# Patient Record
Sex: Female | Born: 1945
Health system: Southern US, Community
[De-identification: ages and names within clinical notes are randomized; demographics above are authoritative.]

## PROBLEM LIST (undated history)

## (undated) DIAGNOSIS — F329 Major depressive disorder, single episode, unspecified: Secondary | ICD-10-CM

## (undated) DIAGNOSIS — F32A Depression, unspecified: Secondary | ICD-10-CM

## (undated) DIAGNOSIS — C801 Malignant (primary) neoplasm, unspecified: Secondary | ICD-10-CM

## (undated) DIAGNOSIS — K219 Gastro-esophageal reflux disease without esophagitis: Secondary | ICD-10-CM

## (undated) DIAGNOSIS — M1990 Unspecified osteoarthritis, unspecified site: Secondary | ICD-10-CM

## (undated) HISTORY — PX: BREAST BIOPSY: SHX20

## (undated) HISTORY — PX: ABDOMINAL HYSTERECTOMY: SHX81

---
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
## 2013-05-08 ENCOUNTER — Ambulatory Visit: Payer: Self-pay | Admitting: Nurse Practitioner

## 2013-06-04 ENCOUNTER — Encounter (INDEPENDENT_AMBULATORY_CARE_PROVIDER_SITE_OTHER): Payer: Medicare Other | Admitting: Ophthalmology

## 2013-06-04 DIAGNOSIS — H43819 Vitreous degeneration, unspecified eye: Secondary | ICD-10-CM

## 2013-06-04 DIAGNOSIS — H348392 Tributary (branch) retinal vein occlusion, unspecified eye, stable: Secondary | ICD-10-CM

## 2013-06-04 DIAGNOSIS — H2510 Age-related nuclear cataract, unspecified eye: Secondary | ICD-10-CM

## 2013-07-05 ENCOUNTER — Emergency Department: Payer: Self-pay | Admitting: Emergency Medicine

## 2013-07-05 LAB — CBC
HCT: 40.7 % (ref 35.0–47.0)
HGB: 14 g/dL (ref 12.0–16.0)
MCH: 30.6 pg (ref 26.0–34.0)
MCHC: 34.4 g/dL (ref 32.0–36.0)
RBC: 4.58 10*6/uL (ref 3.80–5.20)
RDW: 13.5 % (ref 11.5–14.5)

## 2013-07-05 LAB — URINALYSIS, COMPLETE
Bilirubin,UR: NEGATIVE
Blood: NEGATIVE
Glucose,UR: NEGATIVE mg/dL (ref 0–75)
Nitrite: NEGATIVE
Ph: 7 (ref 4.5–8.0)
Protein: NEGATIVE
RBC,UR: 1 /HPF (ref 0–5)
Specific Gravity: 1.015 (ref 1.003–1.030)
Squamous Epithelial: 1

## 2013-07-05 LAB — COMPREHENSIVE METABOLIC PANEL
Albumin: 3.7 g/dL (ref 3.4–5.0)
Alkaline Phosphatase: 59 U/L (ref 50–136)
Chloride: 110 mmol/L — ABNORMAL HIGH (ref 98–107)
Co2: 28 mmol/L (ref 21–32)
EGFR (African American): >60
EGFR (Non-African Amer.): >60
Glucose: 81 mg/dL (ref 65–99)
Potassium: 4.8 mmol/L (ref 3.5–5.1)
SGOT(AST): 31 U/L (ref 15–37)
SGPT (ALT): 19 U/L (ref 12–78)
Total Protein: 6.2 g/dL — ABNORMAL LOW (ref 6.4–8.2)

## 2013-07-05 LAB — CK TOTAL AND CKMB (NOT AT ARMC): CK, Total: 79 U/L (ref 21–215)

## 2013-07-05 LAB — TROPONIN I
Troponin-I: <0.02 ng/mL
Troponin-I: <0.02 ng/mL

## 2013-07-07 LAB — URINE CULTURE

## 2013-10-05 ENCOUNTER — Ambulatory Visit (INDEPENDENT_AMBULATORY_CARE_PROVIDER_SITE_OTHER): Payer: Medicare Other | Admitting: Ophthalmology

## 2013-10-05 DIAGNOSIS — H43819 Vitreous degeneration, unspecified eye: Secondary | ICD-10-CM

## 2013-10-05 DIAGNOSIS — H348392 Tributary (branch) retinal vein occlusion, unspecified eye, stable: Secondary | ICD-10-CM

## 2013-10-05 DIAGNOSIS — H2510 Age-related nuclear cataract, unspecified eye: Secondary | ICD-10-CM

## 2014-10-18 ENCOUNTER — Ambulatory Visit (INDEPENDENT_AMBULATORY_CARE_PROVIDER_SITE_OTHER): Payer: Medicare Other | Admitting: Ophthalmology

## 2014-11-01 ENCOUNTER — Ambulatory Visit (INDEPENDENT_AMBULATORY_CARE_PROVIDER_SITE_OTHER): Payer: PPO | Admitting: Ophthalmology

## 2014-11-01 DIAGNOSIS — H35371 Puckering of macula, right eye: Secondary | ICD-10-CM | POA: Diagnosis not present

## 2014-11-01 DIAGNOSIS — H34831 Tributary (branch) retinal vein occlusion, right eye: Secondary | ICD-10-CM

## 2014-11-01 DIAGNOSIS — H43813 Vitreous degeneration, bilateral: Secondary | ICD-10-CM

## 2014-11-16 ENCOUNTER — Ambulatory Visit: Payer: Self-pay | Admitting: Nurse Practitioner

## 2015-11-07 ENCOUNTER — Ambulatory Visit (INDEPENDENT_AMBULATORY_CARE_PROVIDER_SITE_OTHER): Payer: PPO | Admitting: Ophthalmology

## 2015-11-07 ENCOUNTER — Other Ambulatory Visit: Payer: Self-pay | Admitting: Nurse Practitioner

## 2015-11-07 DIAGNOSIS — Z79899 Other long term (current) drug therapy: Secondary | ICD-10-CM | POA: Diagnosis not present

## 2015-11-07 DIAGNOSIS — Z0000 Encounter for general adult medical examination without abnormal findings: Secondary | ICD-10-CM | POA: Diagnosis not present

## 2015-11-07 DIAGNOSIS — M1990 Unspecified osteoarthritis, unspecified site: Secondary | ICD-10-CM | POA: Diagnosis not present

## 2015-11-07 DIAGNOSIS — K219 Gastro-esophageal reflux disease without esophagitis: Secondary | ICD-10-CM | POA: Diagnosis not present

## 2015-11-07 DIAGNOSIS — F3289 Other specified depressive episodes: Secondary | ICD-10-CM | POA: Diagnosis not present

## 2015-11-07 DIAGNOSIS — E782 Mixed hyperlipidemia: Secondary | ICD-10-CM | POA: Diagnosis not present

## 2015-11-07 DIAGNOSIS — Z1231 Encounter for screening mammogram for malignant neoplasm of breast: Secondary | ICD-10-CM | POA: Diagnosis not present

## 2015-11-07 DIAGNOSIS — Z1159 Encounter for screening for other viral diseases: Secondary | ICD-10-CM | POA: Diagnosis not present

## 2016-05-14 DIAGNOSIS — E782 Mixed hyperlipidemia: Secondary | ICD-10-CM | POA: Diagnosis not present

## 2016-05-14 DIAGNOSIS — Z79899 Other long term (current) drug therapy: Secondary | ICD-10-CM | POA: Diagnosis not present

## 2016-05-14 DIAGNOSIS — K219 Gastro-esophageal reflux disease without esophagitis: Secondary | ICD-10-CM | POA: Diagnosis not present

## 2016-05-14 DIAGNOSIS — F3289 Other specified depressive episodes: Secondary | ICD-10-CM | POA: Diagnosis not present

## 2016-05-14 DIAGNOSIS — R200 Anesthesia of skin: Secondary | ICD-10-CM | POA: Diagnosis not present

## 2016-05-14 DIAGNOSIS — R350 Frequency of micturition: Secondary | ICD-10-CM | POA: Diagnosis not present

## 2016-06-26 DIAGNOSIS — Z961 Presence of intraocular lens: Secondary | ICD-10-CM | POA: Diagnosis not present

## 2016-06-26 DIAGNOSIS — H35371 Puckering of macula, right eye: Secondary | ICD-10-CM | POA: Diagnosis not present

## 2016-08-30 DIAGNOSIS — J0100 Acute maxillary sinusitis, unspecified: Secondary | ICD-10-CM | POA: Diagnosis not present

## 2016-11-06 DIAGNOSIS — M9903 Segmental and somatic dysfunction of lumbar region: Secondary | ICD-10-CM | POA: Diagnosis not present

## 2016-11-06 DIAGNOSIS — M9905 Segmental and somatic dysfunction of pelvic region: Secondary | ICD-10-CM | POA: Diagnosis not present

## 2016-11-06 DIAGNOSIS — M5416 Radiculopathy, lumbar region: Secondary | ICD-10-CM | POA: Diagnosis not present

## 2016-11-06 DIAGNOSIS — M5136 Other intervertebral disc degeneration, lumbar region: Secondary | ICD-10-CM | POA: Diagnosis not present

## 2016-11-07 DIAGNOSIS — M5416 Radiculopathy, lumbar region: Secondary | ICD-10-CM | POA: Diagnosis not present

## 2016-11-07 DIAGNOSIS — M9905 Segmental and somatic dysfunction of pelvic region: Secondary | ICD-10-CM | POA: Diagnosis not present

## 2016-11-07 DIAGNOSIS — M9903 Segmental and somatic dysfunction of lumbar region: Secondary | ICD-10-CM | POA: Diagnosis not present

## 2016-11-07 DIAGNOSIS — M5136 Other intervertebral disc degeneration, lumbar region: Secondary | ICD-10-CM | POA: Diagnosis not present

## 2016-11-08 DIAGNOSIS — M9905 Segmental and somatic dysfunction of pelvic region: Secondary | ICD-10-CM | POA: Diagnosis not present

## 2016-11-08 DIAGNOSIS — M5136 Other intervertebral disc degeneration, lumbar region: Secondary | ICD-10-CM | POA: Diagnosis not present

## 2016-11-08 DIAGNOSIS — M9903 Segmental and somatic dysfunction of lumbar region: Secondary | ICD-10-CM | POA: Diagnosis not present

## 2016-11-08 DIAGNOSIS — M5416 Radiculopathy, lumbar region: Secondary | ICD-10-CM | POA: Diagnosis not present

## 2016-11-12 DIAGNOSIS — F3289 Other specified depressive episodes: Secondary | ICD-10-CM | POA: Diagnosis not present

## 2016-11-12 DIAGNOSIS — Z0000 Encounter for general adult medical examination without abnormal findings: Secondary | ICD-10-CM | POA: Diagnosis not present

## 2016-11-12 DIAGNOSIS — E782 Mixed hyperlipidemia: Secondary | ICD-10-CM | POA: Diagnosis not present

## 2016-11-12 DIAGNOSIS — Z1231 Encounter for screening mammogram for malignant neoplasm of breast: Secondary | ICD-10-CM | POA: Diagnosis not present

## 2016-11-12 DIAGNOSIS — K219 Gastro-esophageal reflux disease without esophagitis: Secondary | ICD-10-CM | POA: Diagnosis not present

## 2016-11-12 DIAGNOSIS — Z79899 Other long term (current) drug therapy: Secondary | ICD-10-CM | POA: Diagnosis not present

## 2016-11-13 DIAGNOSIS — M5136 Other intervertebral disc degeneration, lumbar region: Secondary | ICD-10-CM | POA: Diagnosis not present

## 2016-11-13 DIAGNOSIS — M5416 Radiculopathy, lumbar region: Secondary | ICD-10-CM | POA: Diagnosis not present

## 2016-11-13 DIAGNOSIS — M9903 Segmental and somatic dysfunction of lumbar region: Secondary | ICD-10-CM | POA: Diagnosis not present

## 2016-11-13 DIAGNOSIS — M9905 Segmental and somatic dysfunction of pelvic region: Secondary | ICD-10-CM | POA: Diagnosis not present

## 2016-11-14 DIAGNOSIS — M9905 Segmental and somatic dysfunction of pelvic region: Secondary | ICD-10-CM | POA: Diagnosis not present

## 2016-11-14 DIAGNOSIS — M9903 Segmental and somatic dysfunction of lumbar region: Secondary | ICD-10-CM | POA: Diagnosis not present

## 2016-11-14 DIAGNOSIS — M5416 Radiculopathy, lumbar region: Secondary | ICD-10-CM | POA: Diagnosis not present

## 2016-11-14 DIAGNOSIS — M5136 Other intervertebral disc degeneration, lumbar region: Secondary | ICD-10-CM | POA: Diagnosis not present

## 2016-11-15 DIAGNOSIS — M5136 Other intervertebral disc degeneration, lumbar region: Secondary | ICD-10-CM | POA: Diagnosis not present

## 2016-11-15 DIAGNOSIS — M9905 Segmental and somatic dysfunction of pelvic region: Secondary | ICD-10-CM | POA: Diagnosis not present

## 2016-11-15 DIAGNOSIS — M5416 Radiculopathy, lumbar region: Secondary | ICD-10-CM | POA: Diagnosis not present

## 2016-11-15 DIAGNOSIS — M9903 Segmental and somatic dysfunction of lumbar region: Secondary | ICD-10-CM | POA: Diagnosis not present

## 2016-11-26 DIAGNOSIS — M5136 Other intervertebral disc degeneration, lumbar region: Secondary | ICD-10-CM | POA: Diagnosis not present

## 2016-11-26 DIAGNOSIS — M9903 Segmental and somatic dysfunction of lumbar region: Secondary | ICD-10-CM | POA: Diagnosis not present

## 2016-11-26 DIAGNOSIS — M5416 Radiculopathy, lumbar region: Secondary | ICD-10-CM | POA: Diagnosis not present

## 2016-11-26 DIAGNOSIS — M9905 Segmental and somatic dysfunction of pelvic region: Secondary | ICD-10-CM | POA: Diagnosis not present

## 2016-11-27 DIAGNOSIS — M5136 Other intervertebral disc degeneration, lumbar region: Secondary | ICD-10-CM | POA: Diagnosis not present

## 2016-11-27 DIAGNOSIS — M9905 Segmental and somatic dysfunction of pelvic region: Secondary | ICD-10-CM | POA: Diagnosis not present

## 2016-11-27 DIAGNOSIS — M9903 Segmental and somatic dysfunction of lumbar region: Secondary | ICD-10-CM | POA: Diagnosis not present

## 2016-11-27 DIAGNOSIS — M5416 Radiculopathy, lumbar region: Secondary | ICD-10-CM | POA: Diagnosis not present

## 2016-11-29 DIAGNOSIS — M5416 Radiculopathy, lumbar region: Secondary | ICD-10-CM | POA: Diagnosis not present

## 2016-11-29 DIAGNOSIS — M9905 Segmental and somatic dysfunction of pelvic region: Secondary | ICD-10-CM | POA: Diagnosis not present

## 2016-11-29 DIAGNOSIS — M9903 Segmental and somatic dysfunction of lumbar region: Secondary | ICD-10-CM | POA: Diagnosis not present

## 2016-11-29 DIAGNOSIS — M5136 Other intervertebral disc degeneration, lumbar region: Secondary | ICD-10-CM | POA: Diagnosis not present

## 2016-12-03 DIAGNOSIS — M5416 Radiculopathy, lumbar region: Secondary | ICD-10-CM | POA: Diagnosis not present

## 2016-12-03 DIAGNOSIS — M9903 Segmental and somatic dysfunction of lumbar region: Secondary | ICD-10-CM | POA: Diagnosis not present

## 2016-12-03 DIAGNOSIS — M5136 Other intervertebral disc degeneration, lumbar region: Secondary | ICD-10-CM | POA: Diagnosis not present

## 2016-12-03 DIAGNOSIS — M9905 Segmental and somatic dysfunction of pelvic region: Secondary | ICD-10-CM | POA: Diagnosis not present

## 2016-12-04 DIAGNOSIS — M9903 Segmental and somatic dysfunction of lumbar region: Secondary | ICD-10-CM | POA: Diagnosis not present

## 2016-12-04 DIAGNOSIS — M5136 Other intervertebral disc degeneration, lumbar region: Secondary | ICD-10-CM | POA: Diagnosis not present

## 2016-12-04 DIAGNOSIS — M5416 Radiculopathy, lumbar region: Secondary | ICD-10-CM | POA: Diagnosis not present

## 2016-12-04 DIAGNOSIS — M9905 Segmental and somatic dysfunction of pelvic region: Secondary | ICD-10-CM | POA: Diagnosis not present

## 2016-12-07 DIAGNOSIS — M9905 Segmental and somatic dysfunction of pelvic region: Secondary | ICD-10-CM | POA: Diagnosis not present

## 2016-12-07 DIAGNOSIS — M9903 Segmental and somatic dysfunction of lumbar region: Secondary | ICD-10-CM | POA: Diagnosis not present

## 2016-12-07 DIAGNOSIS — M5136 Other intervertebral disc degeneration, lumbar region: Secondary | ICD-10-CM | POA: Diagnosis not present

## 2016-12-07 DIAGNOSIS — M5416 Radiculopathy, lumbar region: Secondary | ICD-10-CM | POA: Diagnosis not present

## 2016-12-26 DIAGNOSIS — M9905 Segmental and somatic dysfunction of pelvic region: Secondary | ICD-10-CM | POA: Diagnosis not present

## 2016-12-26 DIAGNOSIS — M9903 Segmental and somatic dysfunction of lumbar region: Secondary | ICD-10-CM | POA: Diagnosis not present

## 2016-12-26 DIAGNOSIS — M5416 Radiculopathy, lumbar region: Secondary | ICD-10-CM | POA: Diagnosis not present

## 2016-12-26 DIAGNOSIS — M5136 Other intervertebral disc degeneration, lumbar region: Secondary | ICD-10-CM | POA: Diagnosis not present

## 2016-12-27 DIAGNOSIS — M5416 Radiculopathy, lumbar region: Secondary | ICD-10-CM | POA: Diagnosis not present

## 2016-12-27 DIAGNOSIS — M9905 Segmental and somatic dysfunction of pelvic region: Secondary | ICD-10-CM | POA: Diagnosis not present

## 2016-12-27 DIAGNOSIS — M9903 Segmental and somatic dysfunction of lumbar region: Secondary | ICD-10-CM | POA: Diagnosis not present

## 2016-12-27 DIAGNOSIS — M5136 Other intervertebral disc degeneration, lumbar region: Secondary | ICD-10-CM | POA: Diagnosis not present

## 2016-12-28 DIAGNOSIS — M9905 Segmental and somatic dysfunction of pelvic region: Secondary | ICD-10-CM | POA: Diagnosis not present

## 2016-12-28 DIAGNOSIS — M5136 Other intervertebral disc degeneration, lumbar region: Secondary | ICD-10-CM | POA: Diagnosis not present

## 2016-12-28 DIAGNOSIS — M9903 Segmental and somatic dysfunction of lumbar region: Secondary | ICD-10-CM | POA: Diagnosis not present

## 2016-12-28 DIAGNOSIS — M5416 Radiculopathy, lumbar region: Secondary | ICD-10-CM | POA: Diagnosis not present

## 2016-12-31 DIAGNOSIS — M5416 Radiculopathy, lumbar region: Secondary | ICD-10-CM | POA: Diagnosis not present

## 2016-12-31 DIAGNOSIS — M9903 Segmental and somatic dysfunction of lumbar region: Secondary | ICD-10-CM | POA: Diagnosis not present

## 2016-12-31 DIAGNOSIS — M5136 Other intervertebral disc degeneration, lumbar region: Secondary | ICD-10-CM | POA: Diagnosis not present

## 2016-12-31 DIAGNOSIS — M9905 Segmental and somatic dysfunction of pelvic region: Secondary | ICD-10-CM | POA: Diagnosis not present

## 2017-01-01 DIAGNOSIS — M5136 Other intervertebral disc degeneration, lumbar region: Secondary | ICD-10-CM | POA: Diagnosis not present

## 2017-01-01 DIAGNOSIS — M9903 Segmental and somatic dysfunction of lumbar region: Secondary | ICD-10-CM | POA: Diagnosis not present

## 2017-01-01 DIAGNOSIS — M5416 Radiculopathy, lumbar region: Secondary | ICD-10-CM | POA: Diagnosis not present

## 2017-01-01 DIAGNOSIS — M9905 Segmental and somatic dysfunction of pelvic region: Secondary | ICD-10-CM | POA: Diagnosis not present

## 2017-01-04 DIAGNOSIS — M9905 Segmental and somatic dysfunction of pelvic region: Secondary | ICD-10-CM | POA: Diagnosis not present

## 2017-01-04 DIAGNOSIS — M9903 Segmental and somatic dysfunction of lumbar region: Secondary | ICD-10-CM | POA: Diagnosis not present

## 2017-01-04 DIAGNOSIS — M5136 Other intervertebral disc degeneration, lumbar region: Secondary | ICD-10-CM | POA: Diagnosis not present

## 2017-01-04 DIAGNOSIS — M5416 Radiculopathy, lumbar region: Secondary | ICD-10-CM | POA: Diagnosis not present

## 2017-01-29 DIAGNOSIS — H43812 Vitreous degeneration, left eye: Secondary | ICD-10-CM | POA: Diagnosis not present

## 2017-01-29 DIAGNOSIS — H43392 Other vitreous opacities, left eye: Secondary | ICD-10-CM | POA: Diagnosis not present

## 2017-02-26 DIAGNOSIS — H43392 Other vitreous opacities, left eye: Secondary | ICD-10-CM | POA: Diagnosis not present

## 2017-02-26 DIAGNOSIS — H43812 Vitreous degeneration, left eye: Secondary | ICD-10-CM | POA: Diagnosis not present

## 2017-05-20 DIAGNOSIS — F3289 Other specified depressive episodes: Secondary | ICD-10-CM | POA: Diagnosis not present

## 2017-05-20 DIAGNOSIS — Z79899 Other long term (current) drug therapy: Secondary | ICD-10-CM | POA: Diagnosis not present

## 2017-05-20 DIAGNOSIS — K219 Gastro-esophageal reflux disease without esophagitis: Secondary | ICD-10-CM | POA: Diagnosis not present

## 2017-05-20 DIAGNOSIS — E782 Mixed hyperlipidemia: Secondary | ICD-10-CM | POA: Diagnosis not present

## 2017-06-27 DIAGNOSIS — H43812 Vitreous degeneration, left eye: Secondary | ICD-10-CM | POA: Diagnosis not present

## 2017-06-27 DIAGNOSIS — H43392 Other vitreous opacities, left eye: Secondary | ICD-10-CM | POA: Diagnosis not present

## 2017-11-18 ENCOUNTER — Other Ambulatory Visit: Payer: Self-pay | Admitting: Nurse Practitioner

## 2017-11-18 DIAGNOSIS — Z1231 Encounter for screening mammogram for malignant neoplasm of breast: Secondary | ICD-10-CM | POA: Diagnosis not present

## 2017-11-18 DIAGNOSIS — K219 Gastro-esophageal reflux disease without esophagitis: Secondary | ICD-10-CM | POA: Diagnosis not present

## 2017-11-18 DIAGNOSIS — Z23 Encounter for immunization: Secondary | ICD-10-CM | POA: Diagnosis not present

## 2017-11-18 DIAGNOSIS — E782 Mixed hyperlipidemia: Secondary | ICD-10-CM | POA: Diagnosis not present

## 2017-11-18 DIAGNOSIS — F3289 Other specified depressive episodes: Secondary | ICD-10-CM | POA: Diagnosis not present

## 2017-11-18 DIAGNOSIS — Z0000 Encounter for general adult medical examination without abnormal findings: Secondary | ICD-10-CM | POA: Diagnosis not present

## 2017-11-18 DIAGNOSIS — Z79899 Other long term (current) drug therapy: Secondary | ICD-10-CM | POA: Diagnosis not present

## 2017-11-25 ENCOUNTER — Ambulatory Visit
Admission: RE | Admit: 2017-11-25 | Discharge: 2017-11-25 | Disposition: A | Discharge status: Home / Self Care | Payer: PPO | Source: Ambulatory Visit | Attending: Nurse Practitioner | Admitting: Nurse Practitioner

## 2017-11-25 ENCOUNTER — Encounter (INDEPENDENT_AMBULATORY_CARE_PROVIDER_SITE_OTHER): Payer: Self-pay

## 2017-11-25 DIAGNOSIS — Z1231 Encounter for screening mammogram for malignant neoplasm of breast: Secondary | ICD-10-CM | POA: Insufficient documentation

## 2017-12-10 DIAGNOSIS — L570 Actinic keratosis: Secondary | ICD-10-CM | POA: Diagnosis not present

## 2017-12-10 DIAGNOSIS — C44311 Basal cell carcinoma of skin of nose: Secondary | ICD-10-CM | POA: Diagnosis not present

## 2017-12-10 DIAGNOSIS — L578 Other skin changes due to chronic exposure to nonionizing radiation: Secondary | ICD-10-CM | POA: Diagnosis not present

## 2017-12-10 DIAGNOSIS — Z85828 Personal history of other malignant neoplasm of skin: Secondary | ICD-10-CM | POA: Diagnosis not present

## 2017-12-10 DIAGNOSIS — L814 Other melanin hyperpigmentation: Secondary | ICD-10-CM | POA: Diagnosis not present

## 2017-12-10 DIAGNOSIS — D485 Neoplasm of uncertain behavior of skin: Secondary | ICD-10-CM | POA: Diagnosis not present

## 2017-12-10 DIAGNOSIS — Z872 Personal history of diseases of the skin and subcutaneous tissue: Secondary | ICD-10-CM | POA: Diagnosis not present

## 2017-12-31 DIAGNOSIS — H43812 Vitreous degeneration, left eye: Secondary | ICD-10-CM | POA: Diagnosis not present

## 2017-12-31 DIAGNOSIS — H43392 Other vitreous opacities, left eye: Secondary | ICD-10-CM | POA: Diagnosis not present

## 2018-01-01 DIAGNOSIS — C44311 Basal cell carcinoma of skin of nose: Secondary | ICD-10-CM | POA: Diagnosis not present

## 2018-01-15 DIAGNOSIS — C44311 Basal cell carcinoma of skin of nose: Secondary | ICD-10-CM | POA: Diagnosis not present

## 2018-01-29 DIAGNOSIS — C44311 Basal cell carcinoma of skin of nose: Secondary | ICD-10-CM | POA: Diagnosis not present

## 2018-05-06 DIAGNOSIS — M1711 Unilateral primary osteoarthritis, right knee: Secondary | ICD-10-CM | POA: Diagnosis not present

## 2018-05-06 DIAGNOSIS — M1712 Unilateral primary osteoarthritis, left knee: Secondary | ICD-10-CM | POA: Insufficient documentation

## 2018-05-06 DIAGNOSIS — M25562 Pain in left knee: Secondary | ICD-10-CM | POA: Diagnosis not present

## 2018-05-26 DIAGNOSIS — Z79899 Other long term (current) drug therapy: Secondary | ICD-10-CM | POA: Diagnosis not present

## 2018-05-26 DIAGNOSIS — F3289 Other specified depressive episodes: Secondary | ICD-10-CM | POA: Diagnosis not present

## 2018-05-26 DIAGNOSIS — E782 Mixed hyperlipidemia: Secondary | ICD-10-CM | POA: Diagnosis not present

## 2018-05-26 DIAGNOSIS — R350 Frequency of micturition: Secondary | ICD-10-CM | POA: Diagnosis not present

## 2018-05-26 DIAGNOSIS — K219 Gastro-esophageal reflux disease without esophagitis: Secondary | ICD-10-CM | POA: Diagnosis not present

## 2018-07-21 DIAGNOSIS — Z1283 Encounter for screening for malignant neoplasm of skin: Secondary | ICD-10-CM | POA: Diagnosis not present

## 2018-07-21 DIAGNOSIS — Z85828 Personal history of other malignant neoplasm of skin: Secondary | ICD-10-CM | POA: Diagnosis not present

## 2018-07-21 DIAGNOSIS — L578 Other skin changes due to chronic exposure to nonionizing radiation: Secondary | ICD-10-CM | POA: Diagnosis not present

## 2018-07-21 DIAGNOSIS — L570 Actinic keratosis: Secondary | ICD-10-CM | POA: Diagnosis not present

## 2019-02-26 ENCOUNTER — Other Ambulatory Visit: Payer: Self-pay | Admitting: Nurse Practitioner

## 2019-02-26 DIAGNOSIS — Z1231 Encounter for screening mammogram for malignant neoplasm of breast: Secondary | ICD-10-CM | POA: Diagnosis not present

## 2019-02-26 DIAGNOSIS — E782 Mixed hyperlipidemia: Secondary | ICD-10-CM | POA: Diagnosis not present

## 2019-02-26 DIAGNOSIS — F3289 Other specified depressive episodes: Secondary | ICD-10-CM | POA: Diagnosis not present

## 2019-02-26 DIAGNOSIS — Z0000 Encounter for general adult medical examination without abnormal findings: Secondary | ICD-10-CM | POA: Diagnosis not present

## 2019-02-26 DIAGNOSIS — G479 Sleep disorder, unspecified: Secondary | ICD-10-CM | POA: Diagnosis not present

## 2019-02-26 DIAGNOSIS — M1712 Unilateral primary osteoarthritis, left knee: Secondary | ICD-10-CM | POA: Diagnosis not present

## 2019-02-26 DIAGNOSIS — K219 Gastro-esophageal reflux disease without esophagitis: Secondary | ICD-10-CM | POA: Diagnosis not present

## 2019-02-26 DIAGNOSIS — Z79899 Other long term (current) drug therapy: Secondary | ICD-10-CM | POA: Diagnosis not present

## 2019-02-26 DIAGNOSIS — L989 Disorder of the skin and subcutaneous tissue, unspecified: Secondary | ICD-10-CM | POA: Diagnosis not present

## 2019-02-26 DIAGNOSIS — R350 Frequency of micturition: Secondary | ICD-10-CM | POA: Diagnosis not present

## 2019-03-04 DIAGNOSIS — C44722 Squamous cell carcinoma of skin of right lower limb, including hip: Secondary | ICD-10-CM | POA: Diagnosis not present

## 2019-03-04 DIAGNOSIS — D485 Neoplasm of uncertain behavior of skin: Secondary | ICD-10-CM | POA: Diagnosis not present

## 2019-03-12 DIAGNOSIS — C44722 Squamous cell carcinoma of skin of right lower limb, including hip: Secondary | ICD-10-CM | POA: Diagnosis not present

## 2019-03-17 DIAGNOSIS — M25562 Pain in left knee: Secondary | ICD-10-CM | POA: Diagnosis not present

## 2019-03-17 DIAGNOSIS — M1712 Unilateral primary osteoarthritis, left knee: Secondary | ICD-10-CM | POA: Diagnosis not present

## 2019-04-13 DIAGNOSIS — C44722 Squamous cell carcinoma of skin of right lower limb, including hip: Secondary | ICD-10-CM | POA: Diagnosis not present

## 2019-04-17 DIAGNOSIS — M1712 Unilateral primary osteoarthritis, left knee: Secondary | ICD-10-CM | POA: Diagnosis not present

## 2019-04-22 DIAGNOSIS — I872 Venous insufficiency (chronic) (peripheral): Secondary | ICD-10-CM | POA: Diagnosis not present

## 2019-04-22 DIAGNOSIS — L905 Scar conditions and fibrosis of skin: Secondary | ICD-10-CM | POA: Diagnosis not present

## 2019-04-29 DIAGNOSIS — I872 Venous insufficiency (chronic) (peripheral): Secondary | ICD-10-CM | POA: Diagnosis not present

## 2019-05-06 DIAGNOSIS — L905 Scar conditions and fibrosis of skin: Secondary | ICD-10-CM | POA: Diagnosis not present

## 2019-05-06 DIAGNOSIS — I872 Venous insufficiency (chronic) (peripheral): Secondary | ICD-10-CM | POA: Diagnosis not present

## 2019-05-13 DIAGNOSIS — I872 Venous insufficiency (chronic) (peripheral): Secondary | ICD-10-CM | POA: Diagnosis not present

## 2019-05-20 DIAGNOSIS — I872 Venous insufficiency (chronic) (peripheral): Secondary | ICD-10-CM | POA: Diagnosis not present

## 2019-05-27 DIAGNOSIS — I872 Venous insufficiency (chronic) (peripheral): Secondary | ICD-10-CM | POA: Diagnosis not present

## 2019-06-14 NOTE — Discharge Instructions (Signed)
°  Instructions after Total Knee Replacement ° ° Aleighna Wojtas P. Santina Trillo, Jr., M.D.    ° Dept. of Orthopaedics & Sports Medicine ° [facility] ° [address] ° [place], [place]  [address] ° Phone: [phone]   Fax: [phone] ° °  °DIET: °• Drink plenty of non-alcoholic fluids. °• Resume your normal diet. Include foods high in fiber. ° °ACTIVITY:  °• You may use crutches or a walker with weight-bearing as tolerated, unless instructed otherwise. °• You may be weaned off of the walker or crutches by your Physical Therapist.  °• Do NOT place pillows under the knee. Anything placed under the knee could limit your ability to straighten the knee.   °• Continue doing gentle exercises. Exercising will reduce the pain and swelling, increase motion, and prevent muscle weakness.   °• Please continue to use the TED compression stockings for 6 weeks. You may remove the stockings at night, but should reapply them in the morning. °• Do not drive or operate any equipment until instructed. ° °WOUND CARE:  °• Continue to use the PolarCare or ice packs periodically to reduce pain and swelling. °• You may bathe or shower after the staples are removed at the first office visit following surgery. ° °MEDICATIONS: °• You may resume your regular medications. °• Please take the pain medication as prescribed on the medication. °• Do not take pain medication on an empty stomach. °• You have been given a prescription for a blood thinner (Lovenox or Coumadin). Please take the medication as instructed. (NOTE: After completing a 2 week course of Lovenox, take one Enteric-coated aspirin once a day. This along with elevation will help reduce the possibility of phlebitis in your operated leg.) °• Do not drive or drink alcoholic beverages when taking pain medications. ° °CALL THE OFFICE FOR: °• Temperature above 101 degrees °• Excessive bleeding or drainage on the dressing. °• Excessive swelling, coldness, or paleness of the toes. °• Persistent  nausea and vomiting. ° °FOLLOW-UP:  °• You should have an appointment to return to the office in 10-14 days after surgery. °• Arrangements have been made for continuation of Physical Therapy (either home therapy or outpatient therapy). °  °

## 2019-06-23 ENCOUNTER — Other Ambulatory Visit: Admission: RE | Admit: 2019-06-23 | Payer: PPO | Source: Ambulatory Visit

## 2019-06-24 ENCOUNTER — Other Ambulatory Visit: Payer: Self-pay

## 2019-06-24 ENCOUNTER — Encounter
Admission: RE | Admit: 2019-06-24 | Discharge: 2019-06-24 | Disposition: A | Discharge status: Home / Self Care | Payer: PPO | Source: Ambulatory Visit | Attending: Orthopedic Surgery | Admitting: Orthopedic Surgery

## 2019-06-24 DIAGNOSIS — Z01818 Encounter for other preprocedural examination: Secondary | ICD-10-CM | POA: Insufficient documentation

## 2019-06-24 DIAGNOSIS — Z20828 Contact with and (suspected) exposure to other viral communicable diseases: Secondary | ICD-10-CM | POA: Diagnosis not present

## 2019-06-24 DIAGNOSIS — Z01810 Encounter for preprocedural cardiovascular examination: Secondary | ICD-10-CM | POA: Diagnosis not present

## 2019-06-24 HISTORY — DX: Malignant (primary) neoplasm, unspecified: C80.1

## 2019-06-24 HISTORY — DX: Unspecified osteoarthritis, unspecified site: M19.90

## 2019-06-24 HISTORY — DX: Gastro-esophageal reflux disease without esophagitis: K21.9

## 2019-06-24 HISTORY — DX: Depression, unspecified: F32.A

## 2019-06-24 LAB — URINALYSIS, ROUTINE W REFLEX MICROSCOPIC
Bacteria, UA: NONE SEEN
Bilirubin Urine: NEGATIVE
Glucose, UA: NEGATIVE mg/dL
Hgb urine dipstick: NEGATIVE
Ketones, ur: NEGATIVE mg/dL
Nitrite: NEGATIVE
Protein, ur: NEGATIVE mg/dL
Specific Gravity, Urine: 1.012 (ref 1.005–1.030)
Squamous Epithelial / HPF: NONE SEEN (ref 0–5)
pH: 6 (ref 5.0–8.0)

## 2019-06-24 LAB — COMPREHENSIVE METABOLIC PANEL
ALT: 14 U/L (ref 0–44)
AST: 14 U/L — ABNORMAL LOW (ref 15–41)
Albumin: 4.3 g/dL (ref 3.5–5.0)
Alkaline Phosphatase: 45 U/L (ref 38–126)
Anion gap: 7 (ref 5–15)
BUN: 11 mg/dL (ref 8–23)
CO2: 28 mmol/L (ref 22–32)
Calcium: 8.9 mg/dL (ref 8.9–10.3)
Chloride: 104 mmol/L (ref 98–111)
Creatinine, Ser: 0.59 mg/dL (ref 0.44–1.00)
GFR calc Af Amer: >60 mL/min (ref >60)
GFR calc non Af Amer: >60 mL/min (ref >60)
Glucose, Bld: 96 mg/dL (ref 70–99)
Potassium: 3.8 mmol/L (ref 3.5–5.1)
Sodium: 139 mmol/L (ref 135–145)
Total Bilirubin: 0.5 mg/dL (ref 0.3–1.2)
Total Protein: 6.9 g/dL (ref 6.5–8.1)

## 2019-06-24 LAB — SURGICAL PCR SCREEN
MRSA, PCR: NEGATIVE
Staphylococcus aureus: NEGATIVE

## 2019-06-24 LAB — CBC
HCT: 42.3 % (ref 36.0–46.0)
Hemoglobin: 13.8 g/dL (ref 12.0–15.0)
MCH: 29.2 pg (ref 26.0–34.0)
MCHC: 32.6 g/dL (ref 30.0–36.0)
MCV: 89.6 fL (ref 80.0–100.0)
Platelets: 233 10*3/uL (ref 150–400)
RBC: 4.72 MIL/uL (ref 3.87–5.11)
RDW: 13 % (ref 11.5–15.5)
WBC: 8.4 10*3/uL (ref 4.0–10.5)
nRBC: 0 % (ref 0.0–0.2)

## 2019-06-24 LAB — SEDIMENTATION RATE: Sed Rate: 8 mm/hr (ref 0–30)

## 2019-06-24 LAB — TYPE AND SCREEN
ABO/RH(D): O POS
Antibody Screen: NEGATIVE

## 2019-06-24 LAB — APTT: aPTT: 28 seconds (ref 24–36)

## 2019-06-24 LAB — PROTIME-INR
INR: 1 (ref 0.8–1.2)
Prothrombin Time: 12.8 seconds (ref 11.4–15.2)

## 2019-06-24 LAB — C-REACTIVE PROTEIN: CRP: <0.8 mg/dL (ref <1.0)

## 2019-06-24 NOTE — Patient Instructions (Signed)
Your procedure is scheduled on: 06-29-19 MONDAY Report to Same Day Surgery 2nd floor medical mall [facility] Entrance-take elevator on left to 2nd floor.  Check in with surgery information desk.) To find out your arrival time please call [phone] between 1PM - 3PM on 06-26-19 FRIDAY  Remember: Instructions that are not followed completely may result in serious medical risk, up to and including death, or upon the discretion of your surgeon and anesthesiologist your surgery may need to be rescheduled.    _x___ 1. Do not eat food after midnight the night before your procedure. NO GUM OR CANDY AFTER MIDNIGHT. You may drink clear liquids up to 2 hours before you are scheduled to arrive at the hospital for your procedure.  Do not drink clear liquids within 2 hours of your scheduled arrival to the hospital.  Clear liquids include  --Water or Apple juice without pulp  --Gatorade  --Black Coffee or Clear Tea (No milk, no creamers, do not add anything to the coffee or Tea   ____Ensure clear carbohydrate drink on the way to the hospital for bariatric patients  _X___Ensure clear carbohydrate drink 3 hours before surgery.     __x__ 2. No Alcohol for 24 hours before or after surgery.   __x__3. No Smoking or e-cigarettes for 24 prior to surgery.  Do not use any chewable tobacco products for at least 6 hour prior to surgery   ____  4. Bring all medications with you on the day of surgery if instructed.    __x__ 5. Notify your doctor if there is any change in your medical condition     (cold, fever, infections).    x___6. On the morning of surgery brush your teeth with toothpaste and water.  You may rinse your mouth with mouth wash if you wish.  Do not swallow any toothpaste or mouthwash.   Do not wear jewelry, make-up, hairpins, clips or nail polish.  Do not wear lotions, powders, or perfumes.   Do not shave 48 hours prior to surgery. Men may shave face and neck.  Do not bring valuables to  the hospital.    [facility] is not responsible for any belongings or valuables.               Contacts, dentures or bridgework may not be worn into surgery.  Leave your suitcase in the car. After surgery it may be brought to your room.  For patients admitted to the hospital, discharge time is determined by your treatment team.  _  Patients discharged the day of surgery will not be allowed to drive home.  You will need someone to drive you home and stay with you the night of your procedure.    Please read over the following fact sheets that you were given:   [facility] Preparing for Surgery and or MRSA Information/INCENTIVE SPIROMETER  _x___ TAKE THE FOLLOWING MEDICATION THE MORNING OF SURGERY WITH A SMALL SIP OF WATER. These include:  1. CELEXA (CITALOPRAM)  2. DITROPAN (OXYBUTYNIN)  3. PRILOSEC (OMEPRAZOLE)  4. TAKE AN EXTRA PRILOSEC THE NIGHT BEFORE YOUR SURGERY  5.  6.  ____Fleets enema or Magnesium Citrate as directed.   _x___ Use CHG Soap or sage wipes as directed on instruction sheet   ____ Use inhalers on the day of surgery and bring to hospital day of surgery  ____ Stop Metformin and Janumet 2 days prior to surgery.    ____ Take 1/2 of usual insulin dose the night before  surgery and none on the morning surgery.   ____ Follow recommendations from Cardiologist, Pulmonologist or PCP regarding stopping Aspirin, Coumadin, Plavix ,Eliquis, Effient, or Pradaxa, and Pletal.  X____Stop Anti-inflammatories such as Advil, Aleve, Ibuprofen, Motrin, Naproxen, Naprosyn, Goodies powders or aspirin products NOW-OK to take Tylenol    ____ Stop supplements until after surgery.    ____ Bring C-Pap to the hospital.

## 2019-06-25 ENCOUNTER — Other Ambulatory Visit
Admission: RE | Admit: 2019-06-25 | Discharge: 2019-06-25 | Disposition: A | Discharge status: Home / Self Care | Payer: PPO | Source: Ambulatory Visit | Attending: Orthopedic Surgery | Admitting: Orthopedic Surgery

## 2019-06-25 DIAGNOSIS — Z01818 Encounter for other preprocedural examination: Secondary | ICD-10-CM | POA: Diagnosis not present

## 2019-06-26 LAB — URINE CULTURE
Culture: 10000 — AB
Special Requests: NORMAL

## 2019-06-26 LAB — SARS CORONAVIRUS 2 (TAT 6-24 HRS): SARS Coronavirus 2: NEGATIVE

## 2019-06-28 MED ORDER — CEFAZOLIN SODIUM-DEXTROSE 2-4 GM/100ML-% IV SOLN: 2.0000 g | INTRAVENOUS | Status: DC

## 2019-06-28 MED ORDER — TRANEXAMIC ACID-NACL 1000-0.7 MG/100ML-% IV SOLN
1000.0000 mg | INTRAVENOUS | Status: DC
  Filled 2019-06-28: qty 100

## 2019-06-29 ENCOUNTER — Inpatient Hospital Stay: Payer: PPO

## 2019-06-29 ENCOUNTER — Inpatient Hospital Stay: Payer: PPO | Admitting: Certified Registered Nurse Anesthetist

## 2019-06-29 ENCOUNTER — Encounter
Admission: RE | Disposition: A | Discharge status: Home Health | Payer: Self-pay | Source: Home / Self Care | Attending: Orthopedic Surgery

## 2019-06-29 ENCOUNTER — Other Ambulatory Visit: Payer: Self-pay

## 2019-06-29 ENCOUNTER — Inpatient Hospital Stay
Admission: RE | Admit: 2019-06-29 | Discharge: 2019-07-01 | DRG: 470 | Disposition: A | Discharge status: Home Health | Payer: PPO | Attending: Orthopedic Surgery | Admitting: Orthopedic Surgery

## 2019-06-29 ENCOUNTER — Encounter: Payer: Self-pay | Admitting: Orthopedic Surgery

## 2019-06-29 DIAGNOSIS — M25762 Osteophyte, left knee: Secondary | ICD-10-CM | POA: Diagnosis not present

## 2019-06-29 DIAGNOSIS — Z96652 Presence of left artificial knee joint: Secondary | ICD-10-CM | POA: Diagnosis not present

## 2019-06-29 DIAGNOSIS — Z471 Aftercare following joint replacement surgery: Secondary | ICD-10-CM | POA: Diagnosis not present

## 2019-06-29 DIAGNOSIS — Z791 Long term (current) use of non-steroidal anti-inflammatories (NSAID): Secondary | ICD-10-CM

## 2019-06-29 DIAGNOSIS — F329 Major depressive disorder, single episode, unspecified: Secondary | ICD-10-CM | POA: Diagnosis present

## 2019-06-29 DIAGNOSIS — Z87891 Personal history of nicotine dependence: Secondary | ICD-10-CM | POA: Diagnosis not present

## 2019-06-29 DIAGNOSIS — M1712 Unilateral primary osteoarthritis, left knee: Principal | ICD-10-CM | POA: Diagnosis present

## 2019-06-29 DIAGNOSIS — F32A Depression, unspecified: Secondary | ICD-10-CM | POA: Insufficient documentation

## 2019-06-29 DIAGNOSIS — K219 Gastro-esophageal reflux disease without esophagitis: Secondary | ICD-10-CM | POA: Diagnosis not present

## 2019-06-29 DIAGNOSIS — R350 Frequency of micturition: Secondary | ICD-10-CM | POA: Diagnosis present

## 2019-06-29 DIAGNOSIS — Z79899 Other long term (current) drug therapy: Secondary | ICD-10-CM

## 2019-06-29 DIAGNOSIS — Z85820 Personal history of malignant melanoma of skin: Secondary | ICD-10-CM | POA: Diagnosis not present

## 2019-06-29 DIAGNOSIS — Z96659 Presence of unspecified artificial knee joint: Secondary | ICD-10-CM

## 2019-06-29 DIAGNOSIS — E785 Hyperlipidemia, unspecified: Secondary | ICD-10-CM | POA: Diagnosis not present

## 2019-06-29 HISTORY — PX: KNEE ARTHROPLASTY: SHX992

## 2019-06-29 LAB — ABO/RH: ABO/RH(D): O POS

## 2019-06-29 SURGERY — ARTHROPLASTY, KNEE, TOTAL, USING IMAGELESS COMPUTER-ASSISTED NAVIGATION
Anesthesia: Spinal | Site: Knee | Laterality: Left

## 2019-06-29 MED ORDER — FERROUS SULFATE 325 (65 FE) MG PO TABS
325.0000 mg | ORAL_TABLET | Freq: Two times a day (BID) | ORAL | Status: DC
  Administered 2019-06-30 – 2019-07-01 (×3): 325 mg via ORAL
  Filled 2019-06-29 (×3): qty 1

## 2019-06-29 MED ORDER — OXYBUTYNIN CHLORIDE 5 MG PO TABS
5.0000 mg | ORAL_TABLET | Freq: Every morning | ORAL | Status: DC
  Administered 2019-06-30: 08:00:00 5 mg via ORAL
  Filled 2019-06-29 (×2): qty 1

## 2019-06-29 MED ORDER — PROPOFOL 10 MG/ML IV BOLUS
INTRAVENOUS | Status: DC | PRN
  Administered 2019-06-29 (×2): 13 mg via INTRAVENOUS

## 2019-06-29 MED ORDER — SODIUM CHLORIDE 0.9 % IV SOLN
INTRAVENOUS | Status: DC
  Administered 2019-06-29: 19:00:00 via INTRAVENOUS

## 2019-06-29 MED ORDER — MAGNESIUM HYDROXIDE 400 MG/5ML PO SUSP
30.0000 mL | Freq: Every day | ORAL | Status: DC
  Administered 2019-06-30 – 2019-07-01 (×2): 30 mL via ORAL
  Filled 2019-06-29 (×2): qty 30

## 2019-06-29 MED ORDER — ENSURE PRE-SURGERY PO LIQD
296.0000 mL | Freq: Once | ORAL | Status: AC
  Administered 2019-06-29: 07:00:00 296 mL via ORAL
  Filled 2019-06-29: qty 296

## 2019-06-29 MED ORDER — CHLORHEXIDINE GLUCONATE 4 % EX LIQD: 60.0000 mL | Freq: Once | CUTANEOUS | Status: DC

## 2019-06-29 MED ORDER — LACTATED RINGERS IV SOLN
INTRAVENOUS | Status: DC
  Administered 2019-06-29 (×2): via INTRAVENOUS

## 2019-06-29 MED ORDER — ACETAMINOPHEN 10 MG/ML IV SOLN
INTRAVENOUS | Status: DC | PRN
  Administered 2019-06-29: 1000 mg via INTRAVENOUS

## 2019-06-29 MED ORDER — SODIUM CHLORIDE FLUSH 0.9 % IV SOLN
INTRAVENOUS | Status: AC
  Filled 2019-06-29: qty 10

## 2019-06-29 MED ORDER — BUPIVACAINE HCL (PF) 0.5 % IJ SOLN
INTRAMUSCULAR | Status: DC | PRN
  Administered 2019-06-29: 2.5 mL

## 2019-06-29 MED ORDER — CITALOPRAM HYDROBROMIDE 20 MG PO TABS
40.0000 mg | ORAL_TABLET | Freq: Every morning | ORAL | Status: DC
  Administered 2019-06-30 – 2019-07-01 (×2): 40 mg via ORAL
  Filled 2019-06-29 (×2): qty 2

## 2019-06-29 MED ORDER — METOCLOPRAMIDE HCL 10 MG PO TABS
10.0000 mg | ORAL_TABLET | Freq: Three times a day (TID) | ORAL | Status: DC
  Administered 2019-06-29 – 2019-07-01 (×6): 10 mg via ORAL
  Filled 2019-06-29 (×6): qty 1

## 2019-06-29 MED ORDER — PANTOPRAZOLE SODIUM 40 MG PO TBEC
40.0000 mg | DELAYED_RELEASE_TABLET | Freq: Two times a day (BID) | ORAL | Status: DC
  Administered 2019-06-29 – 2019-07-01 (×4): 40 mg via ORAL
  Filled 2019-06-29 (×4): qty 1

## 2019-06-29 MED ORDER — GABAPENTIN 300 MG PO CAPS
300.0000 mg | ORAL_CAPSULE | Freq: Every day | ORAL | Status: DC
  Administered 2019-06-29 – 2019-06-30 (×2): 300 mg via ORAL
  Filled 2019-06-29 (×2): qty 1

## 2019-06-29 MED ORDER — SODIUM CHLORIDE 0.9 % IV SOLN
INTRAVENOUS | Status: DC | PRN
  Administered 2019-06-29: 30 ug/min via INTRAVENOUS

## 2019-06-29 MED ORDER — CELECOXIB 200 MG PO CAPS
200.0000 mg | ORAL_CAPSULE | Freq: Two times a day (BID) | ORAL | Status: DC
  Administered 2019-06-29 – 2019-07-01 (×4): 200 mg via ORAL
  Filled 2019-06-29 (×4): qty 1

## 2019-06-29 MED ORDER — GABAPENTIN 300 MG PO CAPS
ORAL_CAPSULE | ORAL | Status: AC
  Administered 2019-06-29: 10:00:00 300 mg via ORAL
  Filled 2019-06-29: qty 1

## 2019-06-29 MED ORDER — ENOXAPARIN SODIUM 30 MG/0.3ML ~~LOC~~ SOLN
30.0000 mg | Freq: Two times a day (BID) | SUBCUTANEOUS | Status: DC
  Administered 2019-06-30 – 2019-07-01 (×3): 30 mg via SUBCUTANEOUS
  Filled 2019-06-29 (×5): qty 0.3

## 2019-06-29 MED ORDER — TRANEXAMIC ACID-NACL 1000-0.7 MG/100ML-% IV SOLN
INTRAVENOUS | Status: DC | PRN
  Administered 2019-06-29: 1000 mg via INTRAVENOUS

## 2019-06-29 MED ORDER — PROPOFOL 500 MG/50ML IV EMUL
INTRAVENOUS | Status: DC | PRN
  Administered 2019-06-29: 40 ug/kg/min via INTRAVENOUS

## 2019-06-29 MED ORDER — ACETAMINOPHEN 10 MG/ML IV SOLN
1000.0000 mg | Freq: Four times a day (QID) | INTRAVENOUS | Status: AC
  Administered 2019-06-29 – 2019-06-30 (×4): 1000 mg via INTRAVENOUS
  Filled 2019-06-29 (×4): qty 100

## 2019-06-29 MED ORDER — MIDAZOLAM HCL 2 MG/2ML IJ SOLN
INTRAMUSCULAR | Status: AC
  Filled 2019-06-29: qty 2

## 2019-06-29 MED ORDER — FENTANYL CITRATE (PF) 100 MCG/2ML IJ SOLN
INTRAMUSCULAR | Status: AC
  Filled 2019-06-29: qty 2

## 2019-06-29 MED ORDER — CELECOXIB 200 MG PO CAPS
ORAL_CAPSULE | ORAL | Status: AC
  Administered 2019-06-29: 10:00:00 400 mg via ORAL
  Filled 2019-06-29: qty 2

## 2019-06-29 MED ORDER — METOCLOPRAMIDE HCL 5 MG/ML IJ SOLN: 5.0000 mg | Freq: Three times a day (TID) | INTRAMUSCULAR | Status: DC | PRN

## 2019-06-29 MED ORDER — PRAVASTATIN SODIUM 20 MG PO TABS
20.0000 mg | ORAL_TABLET | Freq: Every day | ORAL | Status: DC
  Administered 2019-06-29 – 2019-06-30 (×2): 20 mg via ORAL
  Filled 2019-06-29 (×2): qty 1

## 2019-06-29 MED ORDER — ONDANSETRON HCL 4 MG PO TABS: 4.0000 mg | ORAL_TABLET | Freq: Four times a day (QID) | ORAL | Status: DC | PRN

## 2019-06-29 MED ORDER — NEOMYCIN-POLYMYXIN B GU 40-200000 IR SOLN
Status: DC | PRN
  Administered 2019-06-29: 16 mL

## 2019-06-29 MED ORDER — PHENOL 1.4 % MT LIQD: 1.0000 | OROMUCOSAL | Status: DC | PRN

## 2019-06-29 MED ORDER — DIPHENHYDRAMINE HCL 12.5 MG/5ML PO ELIX: 12.5000 mg | ORAL_SOLUTION | ORAL | Status: DC | PRN

## 2019-06-29 MED ORDER — GLYCOPYRROLATE 0.2 MG/ML IJ SOLN
INTRAMUSCULAR | Status: DC | PRN
  Administered 2019-06-29: 0.2 mg via INTRAVENOUS

## 2019-06-29 MED ORDER — MIDAZOLAM HCL 5 MG/5ML IJ SOLN
INTRAMUSCULAR | Status: DC | PRN
  Administered 2019-06-29: 2 mg via INTRAVENOUS

## 2019-06-29 MED ORDER — TRAMADOL HCL 50 MG PO TABS
50.0000 mg | ORAL_TABLET | ORAL | Status: DC | PRN
  Administered 2019-06-30: 100 mg via ORAL
  Administered 2019-06-30: 08:00:00 50 mg via ORAL
  Administered 2019-07-01 (×2): 100 mg via ORAL
  Filled 2019-06-29: qty 1
  Filled 2019-06-29 (×3): qty 2

## 2019-06-29 MED ORDER — CEFAZOLIN SODIUM-DEXTROSE 2-3 GM-%(50ML) IV SOLR
INTRAVENOUS | Status: DC | PRN
  Administered 2019-06-29: 2 g via INTRAVENOUS

## 2019-06-29 MED ORDER — FENTANYL CITRATE (PF) 100 MCG/2ML IJ SOLN: 25.0000 ug | INTRAMUSCULAR | Status: DC | PRN

## 2019-06-29 MED ORDER — ONDANSETRON HCL 4 MG/2ML IJ SOLN: 4.0000 mg | Freq: Once | INTRAMUSCULAR | Status: DC | PRN

## 2019-06-29 MED ORDER — SODIUM CHLORIDE 0.9 % IV SOLN
INTRAVENOUS | Status: DC | PRN
  Administered 2019-06-29: 60 mL

## 2019-06-29 MED ORDER — TETRACAINE HCL 1 % IJ SOLN
INTRAMUSCULAR | Status: DC | PRN
  Administered 2019-06-29: 5 mg via INTRASPINAL

## 2019-06-29 MED ORDER — SENNOSIDES-DOCUSATE SODIUM 8.6-50 MG PO TABS
1.0000 | ORAL_TABLET | Freq: Two times a day (BID) | ORAL | Status: DC
  Administered 2019-06-29 – 2019-07-01 (×4): 1 via ORAL
  Filled 2019-06-29 (×4): qty 1

## 2019-06-29 MED ORDER — MENTHOL 3 MG MT LOZG: 1.0000 | LOZENGE | OROMUCOSAL | Status: DC | PRN

## 2019-06-29 MED ORDER — OXYCODONE HCL 5 MG PO TABS
5.0000 mg | ORAL_TABLET | ORAL | Status: DC | PRN
  Administered 2019-06-30 – 2019-07-01 (×4): 5 mg via ORAL
  Filled 2019-06-29 (×4): qty 1

## 2019-06-29 MED ORDER — FLEET ENEMA 7-19 GM/118ML RE ENEM: 1.0000 | ENEMA | Freq: Once | RECTAL | Status: DC | PRN

## 2019-06-29 MED ORDER — ALUM & MAG HYDROXIDE-SIMETH 200-200-20 MG/5ML PO SUSP: 30.0000 mL | ORAL | Status: DC | PRN

## 2019-06-29 MED ORDER — METOCLOPRAMIDE HCL 10 MG PO TABS: 5.0000 mg | ORAL_TABLET | Freq: Three times a day (TID) | ORAL | Status: DC | PRN

## 2019-06-29 MED ORDER — BUPIVACAINE HCL (PF) 0.5 % IJ SOLN
INTRAMUSCULAR | Status: AC
  Filled 2019-06-29: qty 10

## 2019-06-29 MED ORDER — GABAPENTIN 300 MG PO CAPS
300.0000 mg | ORAL_CAPSULE | Freq: Once | ORAL | Status: AC
  Administered 2019-06-29: 10:00:00 300 mg via ORAL

## 2019-06-29 MED ORDER — DEXAMETHASONE SODIUM PHOSPHATE 10 MG/ML IJ SOLN
8.0000 mg | Freq: Once | INTRAMUSCULAR | Status: AC
  Administered 2019-06-29: 10:00:00 8 mg via INTRAVENOUS

## 2019-06-29 MED ORDER — BUPIVACAINE HCL (PF) 0.25 % IJ SOLN
INTRAMUSCULAR | Status: DC | PRN
  Administered 2019-06-29: 60 mL

## 2019-06-29 MED ORDER — CEFAZOLIN SODIUM-DEXTROSE 2-4 GM/100ML-% IV SOLN
INTRAVENOUS | Status: AC
  Filled 2019-06-29: qty 100

## 2019-06-29 MED ORDER — PROPOFOL 500 MG/50ML IV EMUL
INTRAVENOUS | Status: AC
  Filled 2019-06-29: qty 50

## 2019-06-29 MED ORDER — CELECOXIB 200 MG PO CAPS
400.0000 mg | ORAL_CAPSULE | Freq: Once | ORAL | Status: AC
  Administered 2019-06-29: 10:00:00 400 mg via ORAL

## 2019-06-29 MED ORDER — CEFAZOLIN SODIUM-DEXTROSE 2-4 GM/100ML-% IV SOLN
2.0000 g | Freq: Four times a day (QID) | INTRAVENOUS | Status: AC
  Administered 2019-06-30 (×3): 2 g via INTRAVENOUS
  Filled 2019-06-29 (×4): qty 100

## 2019-06-29 MED ORDER — HYDROMORPHONE HCL 1 MG/ML IJ SOLN: 0.5000 mg | INTRAMUSCULAR | Status: DC | PRN

## 2019-06-29 MED ORDER — DEXAMETHASONE SODIUM PHOSPHATE 10 MG/ML IJ SOLN
INTRAMUSCULAR | Status: AC
  Filled 2019-06-29: qty 1

## 2019-06-29 MED ORDER — TRANEXAMIC ACID-NACL 1000-0.7 MG/100ML-% IV SOLN
1000.0000 mg | Freq: Once | INTRAVENOUS | Status: AC
  Administered 2019-06-29: 16:00:00 1000 mg via INTRAVENOUS
  Filled 2019-06-29: qty 100

## 2019-06-29 MED ORDER — OXYCODONE HCL 5 MG PO TABS
10.0000 mg | ORAL_TABLET | ORAL | Status: DC | PRN
  Administered 2019-06-29 – 2019-06-30 (×3): 10 mg via ORAL
  Filled 2019-06-29 (×3): qty 2

## 2019-06-29 MED ORDER — ACETAMINOPHEN 10 MG/ML IV SOLN
INTRAVENOUS | Status: AC
  Filled 2019-06-29: qty 100

## 2019-06-29 MED ORDER — ACETAMINOPHEN 325 MG PO TABS
325.0000 mg | ORAL_TABLET | Freq: Four times a day (QID) | ORAL | Status: DC | PRN
  Filled 2019-06-29: qty 2

## 2019-06-29 MED ORDER — BISACODYL 10 MG RE SUPP
10.0000 mg | Freq: Every day | RECTAL | Status: DC | PRN
  Administered 2019-07-01: 10:00:00 10 mg via RECTAL
  Filled 2019-06-29: qty 1

## 2019-06-29 MED ORDER — ONDANSETRON HCL 4 MG/2ML IJ SOLN: 4.0000 mg | Freq: Four times a day (QID) | INTRAMUSCULAR | Status: DC | PRN

## 2019-06-29 MED ORDER — FENTANYL CITRATE (PF) 100 MCG/2ML IJ SOLN
INTRAMUSCULAR | Status: DC | PRN
  Administered 2019-06-29: 50 ug via INTRAVENOUS

## 2019-06-29 SURGICAL SUPPLY — 76 items
ATTUNE MED DOME PAT 38 KNEE (Knees) ×2 IMPLANT
ATTUNE MED DOME PAT 38MM KNEE (Knees) ×1 IMPLANT
ATTUNE PS FEM LT SZ 6 CEM KNEE (Femur) ×3 IMPLANT
ATTUNE PSRP INSR SZ6 8 KNEE (Insert) ×2 IMPLANT
ATTUNE PSRP INSR SZ6 8MM KNEE (Insert) ×1 IMPLANT
BASE TIBIA ATTUNE KNEE SYS SZ6 (Knees) ×1 IMPLANT
BATTERY INSTRU NAVIGATION (MISCELLANEOUS) ×12 IMPLANT
BLADE SAW 70X12.5 (BLADE) ×3 IMPLANT
BLADE SAW 90X13X1.19 OSCILLAT (BLADE) ×3 IMPLANT
BLADE SAW 90X25X1.19 OSCILLAT (BLADE) ×3 IMPLANT
CANISTER SUCT 3000ML PPV (MISCELLANEOUS) ×3 IMPLANT
CAST PADDING 6X4YD ST 30248 (SOFTGOODS) ×2
CEMENT HV SMART SET (Cement) ×6 IMPLANT
COOLER POLAR GLACIER W/PUMP (MISCELLANEOUS) ×3 IMPLANT
COVER WAND RF STERILE (DRAPES) ×3 IMPLANT
CUFF TOURN SGL QUICK 30 (TOURNIQUET CUFF) ×2
CUFF TRNQT CYL 30X4X21-28X (TOURNIQUET CUFF) ×1 IMPLANT
DRAPE 3/4 80X56 (DRAPES) ×3 IMPLANT
DRSG DERMACEA 8X12 NADH (GAUZE/BANDAGES/DRESSINGS) ×6 IMPLANT
DRSG OPSITE POSTOP 4X14 (GAUZE/BANDAGES/DRESSINGS) ×3 IMPLANT
DRSG TEGADERM 4X4.75 (GAUZE/BANDAGES/DRESSINGS) ×3 IMPLANT
DURAPREP 26ML APPLICATOR (WOUND CARE) ×6 IMPLANT
ELECT REM PT RETURN 9FT ADLT (ELECTROSURGICAL) ×3
ELECTRODE REM PT RTRN 9FT ADLT (ELECTROSURGICAL) ×1 IMPLANT
EX-PIN ORTHOLOCK NAV 4X150 (PIN) ×6 IMPLANT
GAUZE SPONGE 4X4 12PLY STRL (GAUZE/BANDAGES/DRESSINGS) ×3 IMPLANT
GLOVE BIO SURGEON STRL SZ7.5 (GLOVE) ×6 IMPLANT
GLOVE BIOGEL M STRL SZ7.5 (GLOVE) ×6 IMPLANT
GLOVE BIOGEL PI IND STRL 7.5 (GLOVE) ×1 IMPLANT
GLOVE BIOGEL PI INDICATOR 7.5 (GLOVE) ×2
GLOVE INDICATOR 8.0 STRL GRN (GLOVE) ×3 IMPLANT
GOWN STRL REUS W/ TWL LRG LVL3 (GOWN DISPOSABLE) ×2 IMPLANT
GOWN STRL REUS W/ TWL XL LVL3 (GOWN DISPOSABLE) ×1 IMPLANT
GOWN STRL REUS W/TWL LRG LVL3 (GOWN DISPOSABLE) ×4
GOWN STRL REUS W/TWL XL LVL3 (GOWN DISPOSABLE) ×2
HEMOVAC 400CC 10FR (MISCELLANEOUS) ×3 IMPLANT
HOLDER FOLEY CATH W/STRAP (MISCELLANEOUS) ×3 IMPLANT
HOOD PEEL AWAY FLYTE STAYCOOL (MISCELLANEOUS) ×6 IMPLANT
KIT TURNOVER KIT A (KITS) ×3 IMPLANT
KNIFE SCULPS 14X20 (INSTRUMENTS) ×3 IMPLANT
LABEL OR SOLS (LABEL) ×3 IMPLANT
MANIFOLD NEPTUNE II (INSTRUMENTS) ×3 IMPLANT
NDL SAFETY ECLIPSE 18X1.5 (NEEDLE) ×1 IMPLANT
NEEDLE HYPO 18GX1.5 SHARP (NEEDLE) ×2
NEEDLE SPNL 20GX3.5 QUINCKE YW (NEEDLE) ×6 IMPLANT
NS IRRIG 500ML POUR BTL (IV SOLUTION) ×3 IMPLANT
PACK TOTAL KNEE (MISCELLANEOUS) ×3 IMPLANT
PAD ABD DERMACEA PRESS 5X9 (GAUZE/BANDAGES/DRESSINGS) ×3 IMPLANT
PAD WRAPON POLAR KNEE (MISCELLANEOUS) ×1 IMPLANT
PADDING CAST COTTON 6X4 ST (SOFTGOODS) ×1 IMPLANT
PENCIL SMOKE ULTRAEVAC 22 CON (MISCELLANEOUS) ×3 IMPLANT
PIN DRILL QUICK PACK ×3 IMPLANT
PIN FIXATION 1/8DIA X 3INL (PIN) ×9 IMPLANT
PULSAVAC PLUS IRRIG FAN TIP (DISPOSABLE) ×3
SOL .9 NS 3000ML IRR  AL (IV SOLUTION) ×2
SOL .9 NS 3000ML IRR UROMATIC (IV SOLUTION) ×1 IMPLANT
SOL PREP PVP 2OZ (MISCELLANEOUS) ×3
SOLUTION PREP PVP 2OZ (MISCELLANEOUS) ×1 IMPLANT
SPONGE DRAIN TRACH 4X4 STRL 2S (GAUZE/BANDAGES/DRESSINGS) ×3 IMPLANT
STAPLER SKIN PROX 35W (STAPLE) ×3 IMPLANT
STOCKINETTE BIAS CUT 4 980044 (GAUZE/BANDAGES/DRESSINGS) ×3 IMPLANT
STOCKINETTE IMPERV 14X48 (MISCELLANEOUS) IMPLANT
STRAP TIBIA SHORT (MISCELLANEOUS) ×3 IMPLANT
SUCTION FRAZIER HANDLE 10FR (MISCELLANEOUS) ×2
SUCTION TUBE FRAZIER 10FR DISP (MISCELLANEOUS) ×1 IMPLANT
SUT VIC AB 0 CT1 36 (SUTURE) ×3 IMPLANT
SUT VIC AB 1 CT1 36 (SUTURE) ×6 IMPLANT
SUT VIC AB 2-0 CT2 27 (SUTURE) ×3 IMPLANT
SYR 20ML LL LF (SYRINGE) ×3 IMPLANT
SYR 30ML LL (SYRINGE) ×6 IMPLANT
TIBIA ATTUNE KNEE SYS BASE SZ6 (Knees) ×3 IMPLANT
TIP FAN IRRIG PULSAVAC PLUS (DISPOSABLE) ×1 IMPLANT
TOWEL OR 17X26 4PK STRL BLUE (TOWEL DISPOSABLE) ×3 IMPLANT
TOWER CARTRIDGE SMART MIX (DISPOSABLE) ×3 IMPLANT
TRAY FOLEY MTR SLVR 16FR STAT (SET/KITS/TRAYS/PACK) ×3 IMPLANT
WRAPON POLAR PAD KNEE (MISCELLANEOUS) ×3

## 2019-06-29 NOTE — H&P (Signed)
The patient has been re-examined, and the chart reviewed, and there have been no interval changes to the documented history and physical.    The risks, benefits, and alternatives have been discussed at length. The patient expressed understanding of the risks benefits and agreed with plans for surgical intervention.  Willistine Ferrall P. Oziel Beitler, Jr. M.D.    

## 2019-06-29 NOTE — Op Note (Signed)
OPERATIVE NOTE  DATE OF SURGERY:  06/29/2019  PATIENT NAME:  Diane Dennis   DOB: [birth year]  MRN: [id number]  PRE-OPERATIVE DIAGNOSIS: Degenerative arthrosis of the left knee, primary  POST-OPERATIVE DIAGNOSIS:  Same  PROCEDURE:  Left total knee arthroplasty using computer-assisted navigation  SURGEON:  Marciano Sequin. M.D.  ASSISTANT: Kinnie Feil, RN (present and scrubbed throughout the case, critical for assistance with exposure, retraction, instrumentation, and closure)  ANESTHESIA: spinal  ESTIMATED BLOOD LOSS: 50 mL  FLUIDS REPLACED: 1000 mL of crystalloid  TOURNIQUET TIME: 85 minutes  DRAINS: 2 medium Hemovac drains  SOFT TISSUE RELEASES: Anterior cruciate ligament, posterior cruciate ligament, deep medial collateral ligament, patellofemoral ligament  IMPLANTS UTILIZED: DePuy Attune size 6 posterior stabilized femoral component (cemented), size 6 rotating platform tibial component (cemented), 38 mm medialized dome patella (cemented), and an 8 mm stabilized rotating platform polyethylene insert.  INDICATIONS FOR SURGERY: Diane Dennis is a 73 y.o. year old female with a long history of progressive knee pain. X-rays demonstrated severe degenerative changes in tricompartmental fashion. The patient had not seen any significant improvement despite conservative nonsurgical intervention. After discussion of the risks and benefits of surgical intervention, the patient expressed understanding of the risks benefits and agree with plans for total knee arthroplasty.   The risks, benefits, and alternatives were discussed at length including but not limited to the risks of infection, bleeding, nerve injury, stiffness, blood clots, the need for revision surgery, cardiopulmonary complications, among others, and they were willing to proceed.  PROCEDURE IN DETAIL: The patient was brought into the operating room and, after adequate spinal anesthesia was achieved, a  tourniquet was placed on the patient's upper thigh. The patient's knee and leg were cleaned and prepped with alcohol and DuraPrep and draped in the usual sterile fashion. A "timeout" was performed as per usual protocol. The lower extremity was exsanguinated using an Esmarch, and the tourniquet was inflated to 300 mmHg. An anterior longitudinal incision was made followed by a standard mid vastus approach. The deep fibers of the medial collateral ligament were elevated in a subperiosteal fashion off of the medial flare of the tibia so as to maintain a continuous soft tissue sleeve. The patella was subluxed laterally and the patellofemoral ligament was incised. Inspection of the knee demonstrated severe degenerative changes with full-thickness loss of articular cartilage. Osteophytes were debrided using a rongeur. Anterior and posterior cruciate ligaments were excised. Two 4.0 mm Schanz pins were inserted in the femur and into the tibia for attachment of the array of trackers used for computer-assisted navigation. Hip center was identified using a circumduction technique. Distal landmarks were mapped using the computer. The distal femur and proximal tibia were mapped using the computer. The distal femoral cutting guide was positioned using computer-assisted navigation so as to achieve a 5 distal valgus cut. The femur was sized and it was felt that a size 6 femoral component was appropriate. A size 6 femoral cutting guide was positioned and the anterior cut was performed and verified using the computer. This was followed by completion of the posterior and chamfer cuts. Femoral cutting guide for the central box was then positioned in the center box cut was performed.  Attention was then directed to the proximal tibia. Medial and lateral menisci were excised. The extramedullary tibial cutting guide was positioned using computer-assisted navigation so as to achieve a 0 varus-valgus alignment and 3 posterior slope. The  cut was performed and verified using the computer. The proximal tibia  was sized and it was felt that a size 6 tibial tray was appropriate. Tibial and femoral trials were inserted followed by insertion of an 8 mm polyethylene insert. This allowed for excellent mediolateral soft tissue balancing both in flexion and in full extension. Finally, the patella was cut and prepared so as to accommodate a 38 mm medialized dome patella. A patella trial was placed and the knee was placed through a range of motion with excellent patellar tracking appreciated. The femoral trial was removed after debridement of posterior osteophytes. The central post-hole for the tibial component was reamed followed by insertion of a keel punch. Tibial trials were then removed. Cut surfaces of bone were irrigated with copious amounts of normal saline with antibiotic solution using pulsatile lavage and then suctioned dry. Polymethylmethacrylate cement was prepared in the usual fashion using a vacuum mixer. Cement was applied to the cut surface of the proximal tibia as well as along the undersurface of a size 6 rotating platform tibial component. Tibial component was positioned and impacted into place. Excess cement was removed using [employer] fast streamer. Cement was then applied to the cut surfaces of the femur as well as along the posterior flanges of the size 6 femoral component. The femoral component was positioned and impacted into place. Excess cement was removed using [employer] fast streamer. An 8 mm polyethylene trial was inserted and the knee was brought into full extension with steady axial compression applied. Finally, cement was applied to the backside of a 38 mm medialized dome patella and the patellar component was positioned and patellar clamp applied. Excess cement was removed using [employer] fast streamer. After adequate curing of the cement, the tourniquet was deflated after a total tourniquet time of 85 minutes. Hemostasis was achieved using  electrocautery. The knee was irrigated with copious amounts of normal saline with antibiotic solution using pulsatile lavage and then suctioned dry. 20 mL of 1.3% Exparel and 60 mL of 0.25% Marcaine in 40 mL of normal saline was injected along the posterior capsule, medial and lateral gutters, and along the arthrotomy site. An 8 mm stabilized rotating platform polyethylene insert was inserted and the knee was placed through a range of motion with excellent mediolateral soft tissue balancing appreciated and excellent patellar tracking noted. 2 medium drains were placed in the wound bed and brought out through separate stab incisions. The medial parapatellar portion of the incision was reapproximated using interrupted sutures of #1 Vicryl. Subcutaneous tissue was approximated in layers using first #0 Vicryl followed #2-0 Vicryl. The skin was approximated with skin staples. A sterile dressing was applied.  The patient tolerated the procedure well and was transported to the recovery room in stable condition.    Tyric Rodeheaver P. Holley Bouche., M.D.

## 2019-06-29 NOTE — Transfer of Care (Signed)
Immediate Anesthesia Transfer of Care Note  Patient: Diane Dennis  Procedure(s) Performed: COMPUTER ASSISTED TOTAL KNEE ARTHROPLASTY (Left Knee)  Patient Location: PACU  Anesthesia Type:Spinal  Level of Consciousness: awake, alert  and oriented  Airway & Oxygen Therapy: Patient Spontanous Breathing  Post-op Assessment: Report given to RN and Post -op Vital signs reviewed and stable  Post vital signs: Reviewed and stable  Last Vitals:  Vitals Value Taken Time  BP 123/70 06/29/19 1528  Temp 36.2 C 06/29/19 1528  Pulse 71 06/29/19 1528  Resp 11 06/29/19 1528  SpO2 93 % 06/29/19 1528  Vitals shown include unvalidated device data.  Last Pain:  Vitals:   06/29/19 0959  TempSrc: Oral  PainSc: 6          Complications: No apparent anesthesia complications

## 2019-06-29 NOTE — Anesthesia Post-op Follow-up Note (Signed)
Anesthesia QCDR form completed.        

## 2019-06-29 NOTE — Anesthesia Preprocedure Evaluation (Signed)
Anesthesia Evaluation  Patient identified by MRN, date of birth, ID band Patient awake    Reviewed: Allergy & Precautions, NPO status , Patient's Chart, lab work & pertinent test results  Airway Mallampati: III       Dental   Pulmonary former smoker,    Pulmonary exam normal        Cardiovascular negative cardio ROS Normal cardiovascular exam     Neuro/Psych PSYCHIATRIC DISORDERS Depression negative neurological ROS     GI/Hepatic Neg liver ROS, GERD  ,  Endo/Other  negative endocrine ROS  Renal/GU negative Renal ROS     Musculoskeletal  (+) Arthritis , Osteoarthritis,    Abdominal Normal abdominal exam  (+)   Peds negative pediatric ROS (+)  Hematology negative hematology ROS (+)   Anesthesia Other Findings Past Medical History: No date: Arthritis No date: Cancer ([place])     Comment:  melanoma No date: Depression No date: GERD (gastroesophageal reflux disease)  Reproductive/Obstetrics                             Anesthesia Physical Anesthesia Plan  ASA: II  Anesthesia Plan: Spinal   Post-op Pain Management:    Induction: Intravenous  PONV Risk Score and Plan:   Airway Management Planned: Nasal Cannula  Additional Equipment:   Intra-op Plan:   Post-operative Plan:   Informed Consent: I have reviewed the patients History and Physical, chart, labs and discussed the procedure including the risks, benefits and alternatives for the proposed anesthesia with the patient or authorized representative who has indicated his/her understanding and acceptance.     Dental advisory given  Plan Discussed with: CRNA and Surgeon  Anesthesia Plan Comments:         Anesthesia Quick Evaluation

## 2019-06-29 NOTE — Anesthesia Procedure Notes (Signed)
Spinal  Patient location during procedure: OR Start time: 06/29/2019 11:58 AM End time: 06/29/2019 12:01 PM Staffing Resident/CRNA: Bernardo Heater, CRNA Performed: resident/CRNA  Preanesthetic Checklist Completed: patient identified, site marked, surgical consent, pre-op evaluation, timeout performed, IV checked, risks and benefits discussed and monitors and equipment checked Spinal Block Patient position: sitting Prep: Betadine and ChloraPrep Patient monitoring: heart rate, continuous pulse ox, blood pressure and cardiac monitor Approach: midline Location: L3-4 Injection technique: single-shot Needle Needle type: Introducer and Pencan  Needle gauge: 24 G Needle length: 9 cm Additional Notes Negative paresthesia. Negative blood return. Positive free-flowing CSF. Expiration date of kit checked and confirmed. Patient tolerated procedure well, without complications.

## 2019-06-30 ENCOUNTER — Encounter: Payer: Self-pay | Admitting: Orthopedic Surgery

## 2019-06-30 NOTE — Evaluation (Signed)
Occupational Therapy Evaluation Patient Details Name: Diane Dennis MRN: [id number] DOB: Apr 25, 1946 Today's Date: 06/30/2019    History of Present Illness Ms. Diane Dennis is a 73 y.o. who presents with degenerative arthrosis of the left knee. She is now s/p left total knee arthroplasty using computer-assisted navigation.   Clinical Impression   Ms. Diane Dennis was seen for OT evaluation this date, POD#1 from above surgery. Pt was independent in all ADLs prior to surgery, and denies a significant falls history in the past 6 months. She states she has stayed active caring for her husband who had surgery approximately 6 weeks ago as well as her husky "Barth Kirks" who she is eager to get back home to see. Pt is eager to return to PLOF with less pain and improved safety and independence. She currently requires minimal assist for LB dressing while in seated position due to pain and limited AROM of L knee. Pt instructed in polar care mgt, falls prevention strategies, home/routines modifications, DME/AE for LB bathing and dressing tasks, and compression stocking mgt. Handout provided. Pt would benefit from skilled OT services including additional instruction in dressing techniques with or without assistive devices for dressing and bathing skills to support recall and carryover prior to discharge and ultimately to maximize safety, independence, and minimize falls risk and caregiver burden. Do not currently anticipate any OT needs following this hospitalization.       Follow Up Recommendations  No OT follow up;Follow surgeon's recommendation for DC plan and follow-up therapies    Equipment Recommendations  3 in 1 bedside commode    Recommendations for Other Services       Precautions / Restrictions Precautions Precautions: Fall;Knee Precaution Comments: High Fall Restrictions Weight Bearing Restrictions: Yes LLE Weight Bearing: Weight bearing as tolerated      Mobility Bed Mobility Overal  bed mobility: Needs Assistance Bed Mobility: Sit to Supine       Sit to supine: Supervision;Min guard      Transfers Overall transfer level: Needs assistance Equipment used: Rolling walker (2 wheeled) Transfers: Sit to/from Stand Sit to Stand: Min guard         General transfer comment: Pt completes STS from room recliner and room commode with CGA for safety and mgt of lines and leads this date.    Balance Overall balance assessment: Needs assistance Sitting-balance support: Feet supported Sitting balance-Leahy Scale: Good Sitting balance - Comments: Steady static and dynamic sitting during functional tasks.   Standing balance support: During functional activity;Single extremity supported;No upper extremity supported Standing balance-Leahy Scale: Fair Standing balance comment: Pt typically relies on RW for support during standing activities at sink this date. Is able to static stand briefly w/o UE support to wash her hands, but quickly returns BUE to RW for stability.                           ADL either performed or assessed with clinical judgement   ADL Overall ADL's : Needs assistance/impaired                                       General ADL Comments: Pt req. min assist for LB ADL management including bathing and dressing tasks in seated position due to increased pain and decreased ROM in the L knee. Pt ambulates using a RW to room commode where she completes STS with  CGA for safety using hand rail to lower self down and support standing once finished with toileting. Pt completes peri-care independently.     Vision Baseline Vision/History: Wears glasses Wears Glasses: Reading only Patient Visual Report: No change from baseline       Perception     Praxis      Pertinent Vitals/Pain Pain Assessment: 0-10 Pain Score: 5  Pain Location: L knee Pain Descriptors / Indicators: Aching;Sore;Guarding;Grimacing Pain Intervention(s): Limited  activity within patient's tolerance;Monitored during session;Repositioned;Ice applied;RN gave pain meds during session     Hand Dominance Right   Extremity/Trunk Assessment Upper Extremity Assessment Upper Extremity Assessment: Overall WFL for tasks assessed   Lower Extremity Assessment Lower Extremity Assessment: LLE deficits/detail;Defer to PT evaluation LLE Deficits / Details: s/p TKA LLE: Unable to fully assess due to pain LLE Coordination: decreased gross motor   Cervical / Trunk Assessment Cervical / Trunk Assessment: Normal   Communication Communication Communication: No difficulties   Cognition Arousal/Alertness: Awake/alert Behavior During Therapy: WFL for tasks assessed/performed Overall Cognitive Status: Within Functional Limits for tasks assessed                                     General Comments  Hemovac in place at start/end of session.    Exercises Other Exercises Other Exercises: Pt educated in falls prevention strategies, safe use of AE/DME for ADL and mobility, compression stocking mgt strategies, polar care mgt strategies, and routines modifications to support safety and functional independence upon hospital DC.   Shoulder Instructions      Home Living Family/patient expects to be discharged to:: Private residence Living Arrangements: Spouse/significant other Available Help at Discharge: Family;Available 24 hours/day Type of Home: House Home Access: Stairs to enter [employer] of Steps: 5   Home Layout: Able to live on main level with bedroom/bathroom;Full bath on main level;Two level     Bathroom Shower/Tub: Occupational psychologist: Handicapped height Bathroom Accessibility: Yes How Accessible: Accessible via walker Home Equipment: Shower seat - built in          Prior Functioning/Environment Level of Independence: Independent        Comments: Pt reports driving, ambulating in home and community  distances without AD. Independent for ADL/IADL management.        OT Problem List: Decreased strength;Decreased coordination;Pain;Decreased range of motion;Decreased activity tolerance;Decreased safety awareness;Impaired balance (sitting and/or standing);Decreased knowledge of use of DME or AE;Decreased knowledge of precautions      OT Treatment/Interventions: Self-care/ADL training;Balance training;Therapeutic exercise;Therapeutic activities;DME and/or AE instruction;Patient/family education    OT Goals(Current goals can be found in the care plan section) Acute Rehab OT Goals Patient Stated Goal: To go home and see my dog. OT Goal Formulation: With patient Time For Goal Achievement: 07/14/19 Potential to Achieve Goals: Good ADL Goals Pt Will Perform Lower Body Bathing: with set-up;with supervision;sitting/lateral leans;with adaptive equipment(With LRAD PRN for improved safety and functional independence) Pt Will Perform Lower Body Dressing: with set-up;with supervision;sit to/from stand(With LRAD PRN for improved safety and functional independence) Pt Will Transfer to Toilet: with modified independence;ambulating;regular height toilet(With LRAD PRN for improved safety and functional independence) Pt Will Perform Toileting - Clothing Manipulation and hygiene: sit to/from stand;with modified independence;with adaptive equipment(With LRAD PRN for improved safety and functional independence)  OT Frequency: Min 1X/week   Barriers to D/C: Inaccessible home environment  Co-evaluation              AM-PAC OT "6 Clicks" Daily Activity     Outcome Measure Help from another person eating meals?: None Help from another person taking care of personal grooming?: None Help from another person toileting, which includes using toliet, bedpan, or urinal?: A Little Help from another person bathing (including washing, rinsing, drying)?: A Little Help from another person to put on and  taking off regular upper body clothing?: None Help from another person to put on and taking off regular lower body clothing?: A Little 6 Click Score: 21   End of Session Equipment Utilized During Treatment: Gait belt;Rolling walker  Activity Tolerance: Patient tolerated treatment well Patient left: in bed;with call bell/phone within reach;with bed alarm set;with SCD's reapplied;Other (comment)(With polar care in place and activated; with Nsg student in room.)  OT Visit Diagnosis: Other abnormalities of gait and mobility (R26.89);Pain Pain - Right/Left: Left Pain - part of body: Knee;Leg                Time: [id number] OT Time Calculation (min): 37 min Charges:  OT General Charges $OT Visit: 1 Visit OT Evaluation $OT Eval Low Complexity: 1 Low OT Treatments $Self Care/Home Management : 23-37 mins  Shara Blazing, M.S., OTR/L Ascom: [phone] 06/30/19, 11:04 AM

## 2019-06-30 NOTE — Progress Notes (Signed)
Physical Therapy Treatment Patient Details Name: Diane Dennis MRN: [id number] DOB: [birth year] Today's Date: 06/30/2019    History of Present Illness 73 y.o. who presents with degenerative arthrisis of the left knee. She is now s/p left total knee arthroplasty 06/29/19.    PT Comments    Pt continues to do well with POD1 PT, though she was more tired and had soft BP readings this afternoon.  We ambulated fully around the nurses' station again (though with chair follow) and pt did not have lightheadeness or need for rest break, etc.  Pt continues to do well with exercises, quicker to fatigue with them this afternoon and needing more breaks t/o session but overall still progressing nicely.  Pt eager to continue working hard with PT and hopes to go home tomorrow.   Follow Up Recommendations  Home health PT     Equipment Recommendations  Rolling walker with 5" wheels    Recommendations for Other Services       Precautions / Restrictions Precautions Precautions: Fall;Knee Restrictions Weight Bearing Restrictions: Yes LLE Weight Bearing: Weight bearing as tolerated    Mobility  Bed Mobility Overal bed mobility: Modified Independent Bed Mobility: Supine to Sit     Supine to sit: Supervision Sit to supine: Supervision;Min guard   General bed mobility comments: Pt again able to get to sitting EOB w/o issue  Transfers Overall transfer level: Modified independent Equipment used: Rolling walker (2 wheeled) Transfers: Sit to/from Stand Sit to Stand: Supervision         General transfer comment: Minimal cuing for UE use/placement and sequencing, no physical assist  Ambulation/Gait Ambulation/Gait assistance: Supervision Gait Distance (Feet): 200 Feet Assistive device: Rolling walker (2 wheeled)       General Gait Details: Pt again able to circumambulate the nurses' station.  She had more initial hesitation this afternoon, but was again able to assume consistent,  confident cadence relatively early in the loop.  She did use walker slightly more this time but continues to do very well at POD1 mobility.   Stairs             Wheelchair Mobility    Modified Rankin (Stroke Patients Only)       Balance Overall balance assessment: Needs assistance Sitting-balance support: Feet supported Sitting balance-Leahy Scale: Good       Standing balance-Leahy Scale: Good Standing balance comment: Pt safe and confident with walker, able to maintain static standing w/o UEs on initially getting to standing                            Cognition Arousal/Alertness: Awake/alert Behavior During Therapy: WFL for tasks assessed/performed Overall Cognitive Status: Within Functional Limits for tasks assessed                                        Exercises Total Joint Exercises Ankle Circles/Pumps: Strengthening;10 reps Quad Sets: Strengthening;15 reps Short Arc Quad: Strengthening;10 reps Heel Slides: Strengthening;10 reps(resisted leg extension) Hip ABduction/ADduction: Strengthening;10 reps Straight Leg Raises: Strengthening;10 reps Knee Flexion: PROM;5 reps Goniometric ROM: 0-83    General Comments        Pertinent Vitals/Pain Pain Assessment: 0-10 Pain Score: 5  Pain Location: L knee    Home Living Family/patient expects to be discharged to:: Private residence Living Arrangements: Spouse/significant other Available Help at Discharge: Family;Available 24 hours/day  Type of Home: House Home Access: Stairs to enter Entrance Stairs-Rails: None(has partial surface for support) Home Layout: Able to live on main level with bedroom/bathroom;Full bath on main level;Two level Home Equipment: Shower seat - built in      Prior Function Level of Independence: Independent      Comments: Pt reports driving, ambulating in home and community distances without AD. Independent for ADL/IADL management.   PT Goals (current  goals can now be found in the care plan section) Acute Rehab PT Goals Patient Stated Goal: get home to her dog PT Goal Formulation: With patient Time For Goal Achievement: 07/14/19 Potential to Achieve Goals: Good Progress towards PT goals: Progressing toward goals    Frequency    BID      PT Plan Current plan remains appropriate    Co-evaluation              AM-PAC PT "6 Clicks" Mobility   Outcome Measure  Help needed turning from your back to your side while in a flat bed without using bedrails?: None Help needed moving from lying on your back to sitting on the side of a flat bed without using bedrails?: None Help needed moving to and from a bed to a chair (including a wheelchair)?: None Help needed standing up from a chair using your arms (e.g., wheelchair or bedside chair)?: None Help needed to walk in hospital room?: None Help needed climbing 3-5 steps with a railing? : A Little 6 Click Score: 23    End of Session Equipment Utilized During Treatment: Gait belt Activity Tolerance: Patient tolerated treatment well Patient left: with chair alarm set;with call bell/phone within reach Nurse Communication: Mobility status PT Visit Diagnosis: Muscle weakness (generalized) (M62.81);Difficulty in walking, not elsewhere classified (R26.2);Pain Pain - Right/Left: Left Pain - part of body: Knee     Time: [id number] PT Time Calculation (min) (ACUTE ONLY): 41 min  Charges:  $Gait Training: 8-22 mins $Therapeutic Exercise: 23-37 mins                     Kreg Shropshire, DPT 06/30/2019, 4:28 PM

## 2019-06-30 NOTE — TOC Progression Note (Signed)
Transition of Care [facility]) - Progression Note    Patient Details  Name: Diane Dennis MRN: [id number] Date of Birth: [birth year]  [facility] CM/SW Contact  Eriberto Felch, Lenice Llamas Phone Number: [phone]  06/30/2019, 9:04 AM  Clinical Narrative: Lovenox price requested.           Expected Discharge Plan and Services                                                 Social Determinants of Health (SDOH) Interventions    Readmission Risk Interventions No flowsheet data found.

## 2019-06-30 NOTE — TOC Initial Note (Signed)
Transition of Care [facility]) - Initial/Assessment Note    Patient Details  Name: Diane Dennis MRN: [id number] Date of Birth: 13-Dec-1945  [facility]) CM/SW Contact:    Nikeia Henkes, Lenice Llamas Phone Number: [phone]  06/30/2019, 4:50 PM  Clinical Narrative: PT is recommending home health. Clinical Education officer, museum (CSW) met with patient at bedside to discuss D/C plan. Patient was alert and oriented X4 and was sitting up in the bed. CSW introduced self and explained role of CSW department. Per patient she lives in [place] with her husband Diane Dennis. Patient is agreeable to home health and does not have a home health agency preference. Per Helene Kelp [facility] home health agency representative they can accept patient. Patient is agreeable to [facility]. CSW provided patient CMS home health list. Patient requested a rolling walker. Patient declined a bedside commode because she has elevated toilet seats and a bench in her shower. [facility] [employer] agency representative is aware of the need for rolling walker. CSW notified patient of her Lovenox price $29.46. Patient reported no other needs or concerns. CSW will continue to follow and assist as needed.                 Expected Discharge Plan: [place] Barriers to Discharge: Continued Medical Work up   Patient Goals and CMS Choice Patient states their goals for this hospitalization and ongoing recovery are:: To go home. CMS Medicare.gov Compare Post Acute Care list provided to:: Patient Choice offered to / list presented to : Patient  Expected Discharge Plan and Services Expected Discharge Plan: [place] In-house Referral: Clinical Social Work   Post Acute Care Choice: [place] arrangements for the past 2 months: [place]                 DME Arranged: Walker rolling DME Agency: [employer] Date DME Agency Contacted: 06/30/19 Time DME Agency Contacted: [phone] Representative spoke  with at DME Agency: [place]: PT [place]: [facility] (formerly [employer]) Date [place]: 06/30/19 Time [place]: 0915 Representative spoke with at [place]: [place] Arrangements/Services Living arrangements for the past 2 months: [place] Lives with:: Spouse Patient language and need for interpreter reviewed:: No Do you feel safe going back to the place where you live?: Yes      Need for Family Participation in Patient Care: No (Comment) Care giver support system in place?: Yes (comment)   Criminal Activity/Legal Involvement Pertinent to Current Situation/Hospitalization: No - Comment as needed  Activities of Daily Living Home Assistive Devices/Equipment: None ADL Screening (condition at time of admission) Patient's cognitive ability adequate to safely complete daily activities?: Yes Is the patient deaf or have difficulty hearing?: No Does the patient have difficulty seeing, even when wearing glasses/contacts?: No Does the patient have difficulty concentrating, remembering, or making decisions?: No Patient able to express need for assistance with ADLs?: Yes Does the patient have difficulty dressing or bathing?: No Independently performs ADLs?: Yes (appropriate for developmental age) Does the patient have difficulty walking or climbing stairs?: No Weakness of Legs: None Weakness of Arms/Hands: None  Permission Sought/Granted Permission sought to share information with : Other (comment)(Home Health agency and DME agency) Permission granted to share information with : Yes, Verbal Permission Granted              Emotional Assessment Appearance:: Appears stated age Attitude/Demeanor/Rapport: Engaged Affect (typically  observed): Pleasant, Calm Orientation: : Oriented to Self, Oriented to Place, Oriented to  Time, Oriented to Situation Alcohol / Substance Use: Not Applicable Psych Involvement: No  (comment)  Admission diagnosis:  PRIMARY OSTEOARTHRITIS OF LEFT KNEE. Patient Active Problem List   Diagnosis Date Noted  . Depression 06/29/2019  . GERD (gastroesophageal reflux disease) 06/29/2019  . Hyperlipidemia 06/29/2019  . Increased frequency of urination 06/29/2019  . Total knee replacement status 06/29/2019  . Primary osteoarthritis of left knee 05/06/2018  . Primary osteoarthritis of right knee 05/06/2018   PCP:  Sallee Lange, NP Pharmacy:   [facility] [id number] - [address] [place] [address] Phone: [phone] Fax: [phone]     Social Determinants of Health (SDOH) Interventions    Readmission Risk Interventions No flowsheet data found.

## 2019-06-30 NOTE — Progress Notes (Signed)
D: Pt alert and oriented. One occurrence on low BP. Pt was sat up, fluids encouraged. Physician notified. Pt VS retaken after pt ate and Pt's BP is now stable. OOB w/PT.  A: Scheduled medications administered to pt, per MD orders. Support and encouragement provided. Frequent verbal contact made.    R: No adverse drug reactions noted. Pt complaint with medications and treatment plan. Pt interacts well with staff on the unit. Pt is stable, will continue to monitor and provide care as ordered.

## 2019-06-30 NOTE — Progress Notes (Signed)
  Subjective: 1 Day Post-Op Procedure(s) (LRB): COMPUTER ASSISTED TOTAL KNEE ARTHROPLASTY (Left) Patient reports pain as  well-controlled.   Patient is well, and has had no acute complaints or problems Plan is to go Home after hospital stay. Negative for chest pain and shortness of breath Fever: no Gastrointestinal: negative for nausea and vomiting.  Patient has not had a bowel movement.  Objective: Vital signs in last 24 hours: Temp:  [97.2 F (36.2 C)-98.5 F (36.9 C)] 98.5 F (36.9 C) (10/27 0447) Pulse Rate:  [61-89] 70 (10/27 0447) Resp:  [10-19] 18 (10/27 0447) BP: (100-137)/(62-90) 100/62 (10/27 0447) SpO2:  [92 %-98 %] 93 % (10/27 0447) Weight:  [85 kg] 85 kg (10/26 1815)  Intake/Output from previous day:  Intake/Output Summary (Last 24 hours) at 06/30/2019 0750 Last data filed at 06/30/2019 0456 Gross per 24 hour  Intake 2343.27 ml  Output 1540 ml  Net 803.27 ml    Intake/Output this shift: No intake/output data recorded.  Labs: No results for input(s): HGB in the last 72 hours. No results for input(s): WBC, RBC, HCT, PLT in the last 72 hours. No results for input(s): NA, K, CL, CO2, BUN, CREATININE, GLUCOSE, CALCIUM in the last 72 hours. No results for input(s): LABPT, INR in the last 72 hours.   EXAM General - Patient is Alert, Appropriate and Oriented Extremity - Neurovascular intact Dorsiflexion/Plantar flexion intact Compartment soft Dressing/Incision - post-op dressing remains in place. Polar Care in place. Motor Function - intact, moving foot and toes well on exam. Able to perform SLR Cardiovascular- Regular rate and rhythm, no murmurs/rubs/gallops Respiratory- Lungs clear to auscultation bilaterally Gastrointestinal- active bowel sounds   Assessment/Plan: 1 Day Post-Op Procedure(s) (LRB): COMPUTER ASSISTED TOTAL KNEE ARTHROPLASTY (Left) Active Problems:   Total knee replacement status  Estimated body mass index is 27.67 kg/m as calculated  from the following:   Height as of this encounter: 5\' 9"  (1.753 m).   Weight as of this encounter: 85 kg. Advance diet Up with therapy Plan for discharge tomorrow    DVT Prophylaxis - Lovenox and foot pumps Weight-Bearing as tolerated to left leg  Cassell Smiles, PA-C [facility] Orthopaedic Surgery 06/30/2019, 7:50 AM

## 2019-06-30 NOTE — Evaluation (Signed)
Physical Therapy Evaluation Patient Details Name: Diane Dennis MRN: [id number] DOB: 1945/10/01 Today's Date: 06/30/2019   History of Present Illness  73 y.o. who presents with degenerative arthrisis of the left knee. She is now s/p left total knee arthroplasty 06/29/19.  Clinical Impression  Pt eager to work with PT and over all did very well.  She was easily able to perform SLRs without warm up, showed great quad control and actively had 0-75 AROM (83* PROM) and did not need physical assist with any mobility. She was able to confidently and safely circumambulate the nurses' station w/o excessive reliance on the walker (She did have some drop in O2 with prolonged ambulation (to 89% on room air). Overall has surpassed typical POD1 goals w/o issue.    Follow Up Recommendations Home health PT    Equipment Recommendations  Rolling walker with 5" wheels    Recommendations for Other Services       Precautions / Restrictions Precautions Precautions: Fall;Knee Precaution Comments: High Fall Restrictions Weight Bearing Restrictions: Yes LLE Weight Bearing: Weight bearing as tolerated      Mobility  Bed Mobility Overal bed mobility: Modified Independent Bed Mobility: Supine to Sit     Supine to sit: Supervision Sit to supine: Supervision;Min guard   General bed mobility comments: Pt was able to rise to sitting EOB w/o assist and w/o hesitation  Transfers Overall transfer level: Modified independent Equipment used: Rolling walker (2 wheeled) Transfers: Sit to/from Stand Sit to Stand: Supervision         General transfer comment: Minimal cuing for UE use/placement and sequencing, no physical assist  Ambulation/Gait Ambulation/Gait assistance: Supervision Gait Distance (Feet): 200 Feet Assistive device: Rolling walker (2 wheeled)       General Gait Details: Pt was able to quickly assume consistent and confident cadence with safe circumambulation of the nurses'  station.  She needed only minimal cuing for mechanics and step length adjustments and continued to improve t/o the effort.  Pt did have   Stairs            Wheelchair Mobility    Modified Rankin (Stroke Patients Only)       Balance Overall balance assessment: Needs assistance Sitting-balance support: Feet supported Sitting balance-Leahy Scale: Good Sitting balance - Comments: Steady static and dynamic sitting during functional tasks.   Standing balance support: During functional activity;Single extremity supported;No upper extremity supported Standing balance-Leahy Scale: Good Standing balance comment: Pt not overly reliant on the walker, showed good safety and confidence with standing acts.                              Pertinent Vitals/Pain Pain Assessment: 0-10 Pain Score: 4  Pain Location: L knee Pain Descriptors / Indicators: Aching;Sore;Guarding;Grimacing Pain Intervention(s): Limited activity within patient's tolerance;Monitored during session;Repositioned;Ice applied;RN gave pain meds during session    [place] expects to be discharged to:: Private residence Living Arrangements: Spouse/significant other Available Help at Discharge: Family;Available 24 hours/day Type of Home: House Home Access: Stairs to enter Entrance Stairs-Rails: None(has partial surface for support) Entrance Stairs-Number of Steps: 5 Home Layout: Able to live on main level with bedroom/bathroom;Full bath on main level;Two level Home Equipment: Shower seat - built in      Prior Function Level of Independence: Independent         Comments: Pt reports driving, ambulating in home and community distances without AD. Independent for ADL/IADL management.  Hand Dominance   Dominant Hand: Right    Extremity/Trunk Assessment   Upper Extremity Assessment Upper Extremity Assessment: Overall WFL for tasks assessed    Lower Extremity Assessment Lower  Extremity Assessment: Overall WFL for tasks assessed LLE Deficits / Details: expected post-op weakness - strong quad control, confident SLF LLE: Unable to fully assess due to pain LLE Coordination: decreased gross motor    Cervical / Trunk Assessment Cervical / Trunk Assessment: Normal  Communication   Communication: No difficulties  Cognition Arousal/Alertness: Awake/alert Behavior During Therapy: WFL for tasks assessed/performed Overall Cognitive Status: Within Functional Limits for tasks assessed                                        General Comments General comments (skin integrity, edema, etc.): Hemovac in place at start/end of session.    Exercises Total Joint Exercises Ankle Circles/Pumps: Strengthening;10 reps Quad Sets: Strengthening;10 reps Short Arc Quad: Strengthening;10 reps Heel Slides: Strengthening;10 reps Hip ABduction/ADduction: Strengthening;10 reps Straight Leg Raises: Strengthening;10 reps Knee Flexion: PROM;5 reps Goniometric ROM: 0-83 Other Exercises Other Exercises: Pt educated in falls prevention strategies, safe use of AE/DME for ADL and mobility, compression stocking mgt strategies, polar care mgt strategies, and routines modifications to support safety and functional independence upon hospital DC.   Assessment/Plan    PT Assessment Patient needs continued PT services  PT Problem List Decreased strength;Decreased range of motion;Decreased activity tolerance;Decreased balance;Decreased coordination;Decreased mobility;Decreased knowledge of use of DME;Decreased safety awareness;Pain       PT Treatment Interventions DME instruction;Gait training;Stair training;Functional mobility training;Therapeutic activities;Therapeutic exercise;Balance training;Neuromuscular re-education;Patient/family education    PT Goals (Current goals can be found in the Care Plan section)  Acute Rehab PT Goals Patient Stated Goal: get home to her dog PT  Goal Formulation: With patient Time For Goal Achievement: 07/14/19 Potential to Achieve Goals: Good    Frequency BID   Barriers to discharge        Co-evaluation               AM-PAC PT "6 Clicks" Mobility  Outcome Measure Help needed turning from your back to your side while in a flat bed without using bedrails?: None Help needed moving from lying on your back to sitting on the side of a flat bed without using bedrails?: None Help needed moving to and from a bed to a chair (including a wheelchair)?: None Help needed standing up from a chair using your arms (e.g., wheelchair or bedside chair)?: None Help needed to walk in hospital room?: None Help needed climbing 3-5 steps with a railing? : A Little 6 Click Score: 23    End of Session Equipment Utilized During Treatment: Gait belt Activity Tolerance: Patient tolerated treatment well Patient left: with chair alarm set;with call bell/phone within reach   PT Visit Diagnosis: Muscle weakness (generalized) (M62.81);Difficulty in walking, not elsewhere classified (R26.2);Pain Pain - Right/Left: Left Pain - part of body: Knee    Time: [id number] PT Time Calculation (min) (ACUTE ONLY): 43 min   Charges:   PT Evaluation $PT Eval Low Complexity: 1 Low PT Treatments $Gait Training: 8-22 mins $Therapeutic Exercise: 8-22 mins        Kreg Shropshire, DPT 06/30/2019, 1:27 PM

## 2019-06-30 NOTE — TOC Benefit Eligibility Note (Signed)
Transition of Care [facility]) Benefit Eligibility Note    Patient Details  Name: Diane Dennis MRN: [id number] Date of Birth: [birth year]   Medication/Dose: Enoxaparin 40 mg daily x 14 days  Covered?: Yes(Generic - Enoxaparin)  Tier: Other(Tier 4)  Prescription Coverage Preferred Pharmacy: [facility] in [place] is fine  Spoke with Person/Company/Phone Number:: Renard Hamper, [phone]  Co-Pay: She said estimated co-pay for 14 day supply is $29.46  Prior Approval: No  Deductible: (No deductible)  Additional Notes: Lovenox not on formulary    [place] Phone Number: 06/30/2019, 10:38 AM

## 2019-06-30 NOTE — Anesthesia Postprocedure Evaluation (Cosign Needed)
Anesthesia Post Note  Patient: Diane Dennis  Procedure(s) Performed: COMPUTER ASSISTED TOTAL KNEE ARTHROPLASTY (Left Knee)  Patient location during evaluation: Nursing Unit Anesthesia Type: Spinal Level of consciousness: awake, oriented and awake and alert Pain management: satisfactory to patient Vital Signs Assessment: post-procedure vital signs reviewed and stable Respiratory status: spontaneous breathing Cardiovascular status: blood pressure returned to baseline and stable Postop Assessment: no headache Anesthetic complications: no Comments: Sitting up in bed, using respirometer, moderate pain, RN into administer IV Tylenol     Last Vitals:  Vitals:   06/30/19 0447 06/30/19 0757  BP: 100/62 124/72  Pulse: 70 68  Resp: 18 17  Temp: 36.9 C 36.9 C  SpO2: 93% 95%    Last Pain:  Vitals:   06/30/19 0828  TempSrc:   PainSc: 7                  Buckner [place]

## 2019-07-01 MED ORDER — OXYCODONE HCL 5 MG PO TABS: 5.0000 mg | ORAL_TABLET | ORAL | 0 refills | Status: AC | PRN

## 2019-07-01 MED ORDER — CELECOXIB 200 MG PO CAPS: 200.0000 mg | ORAL_CAPSULE | Freq: Two times a day (BID) | ORAL | 0 refills | Status: AC

## 2019-07-01 MED ORDER — ENOXAPARIN SODIUM 40 MG/0.4ML ~~LOC~~ SOLN: 40.0000 mg | SUBCUTANEOUS | 0 refills | Status: AC

## 2019-07-01 NOTE — Progress Notes (Signed)
Occupational Therapy Treatment Patient Details Name: Diane Dennis MRN: [id number] DOB: 08-Aug-1946 Today's Date: 07/01/2019    History of present illness 73 y.o. who presents with degenerative arthrisis of the left knee. She is now s/p left total knee arthroplasty 06/29/19.   OT comments  Diane Dennis was seen for OT treatment on this date. Pt received awake/alert, supine in bed stating she would like to go to the bathroom. OT assisted pt to room commode and provided reinforcement on safety education during functional mobility and toilet transfer this date. Pt demonstrated good recall and carryover of education provided at prior session. She uses room commode for BM given supervision for safety, and does not require physical assistance for functional transfer or peri-care this date. Afterward, OT engaged pt in LB dressing tasks and provided education on safe use of AE for LB dressing as well as compensatory dressing strategies for mgt of her L knee pain. Pt return demonstrated understanding of instruction provided by donning socks and leggings this date (see ADL section below for additional detail). Pt making good progress toward goals and continues to benefit from skilled OT services to maximize return to PLOF and minimize risk of future falls, injury, caregiver burden, and readmission. Will continue to follow POC. Discharge recommendation remains appropriate.    Follow Up Recommendations  No OT follow up;Follow surgeon's recommendation for DC plan and follow-up therapies    Equipment Recommendations  3 in 1 bedside commode    Recommendations for Other Services      Precautions / Restrictions Precautions Precautions: Fall;Knee Precaution Comments: High Fall Restrictions Weight Bearing Restrictions: Yes LLE Weight Bearing: Weight bearing as tolerated       Mobility Bed Mobility Overal bed mobility: Modified Independent Bed Mobility: Supine to Sit     Supine to sit: Modified  independent (Device/Increase time) Sit to supine: Supervision   General bed mobility comments: Pt comes to sitting EOB with ease. HOB only minimally elevated this date  Transfers Overall transfer level: Modified independent Equipment used: Rolling walker (2 wheeled) Transfers: Sit to/from Stand Sit to Stand: Supervision         General transfer comment: Good recall of safety and hand placement during STS. OT provided education on completing STS to/from low commode this date.    Balance Overall balance assessment: Needs assistance Sitting-balance support: Feet supported Sitting balance-Leahy Scale: Good Sitting balance - Comments: Steady static and dynamic sitting during functional tasks.   Standing balance support: No upper extremity supported;During functional activity Standing balance-Leahy Scale: Good Standing balance comment: Pt stands at sink to wash hands w/o use of UE support to maintain static standing balance.                           ADL either performed or assessed with clinical judgement   ADL Overall ADL's : Needs assistance/impaired Eating/Feeding: Sitting;Independent   Grooming: Sitting;Wash/dry hands;Modified independent Grooming Details (indicate cue type and reason): Pt stands at sink to wash hands this date. Uses RW for functional transfer to sink and to maintain standing balance intermittently during task.     Lower Body Bathing: Set up;Minimal assistance;Sit to/from stand Lower Body Bathing Details (indicate cue type and reason): Pt dons LB clothing including leggings and socks using AE this date. OT engages pt in LB dressing task to determine fit for AE. Pt return demonstrates understanding of education provided on use of both the sock aid and long handle reacher. Pt dons  sock with min VCs for technique. She dons leggings given min assist for mgt of narrow leg openings this date. Pt educated on clothing alternatives to promote functional  independence including loose fitting stretch pants,         Toilet Transfer: Scientist, physiological;Set up Toilet Transfer Details (indicate cue type and reason): Pt ambulates to room commode for BM this date. She requires set-up/supervision for safety, but otherwise comppletes toilet transfer without additional assistance. Toileting- Clothing Manipulation and Hygiene: Supervision/safety;Sit to/from stand Toileting - Clothing Manipulation Details (indicate cue type and reason): Pt completes peri-care with supervision for safety this date. Does not require physical assist and maintains good dynamic seated balance during weigth shift.             Vision Baseline Vision/History: Wears glasses Wears Glasses: Reading only Patient Visual Report: No change from baseline     Perception     Praxis      Cognition Arousal/Alertness: Awake/alert Behavior During Therapy: WFL for tasks assessed/performed Overall Cognitive Status: Within Functional Limits for tasks assessed                                          Exercises Other Exercises Other Exercises: Pt provided with reinforcement of prior education on falls prevention strategies, safe use of AE for ADL managment, compression stocking management, and polar care management this date. Other Exercises: OT engages pt in variety of ADLs during session including functional mobility, grooming, toileting, and LB dressing this date. Pt return demonstrates understanding of education provided on AE for LB dressing.   Shoulder Instructions       General Comments      Pertinent Vitals/ Pain       Pain Score: 8  Pain Location: L knee Pain Descriptors / Indicators: Aching;Sore;Guarding;Grimacing Pain Intervention(s): Limited activity within patient's tolerance;Monitored during session;Premedicated before session;Utilized relaxation techniques  Home Living                                           Prior Functioning/Environment              Frequency  Min 1X/week        Progress Toward Goals  OT Goals(current goals can now be found in the care plan section)  Progress towards OT goals: Progressing toward goals  Acute Rehab OT Goals Patient Stated Goal: get home to her dog OT Goal Formulation: With patient Time For Goal Achievement: 07/14/19 Potential to Achieve Goals: Good  Plan Discharge plan remains appropriate;Frequency remains appropriate    Co-evaluation                 AM-PAC OT "6 Clicks" Daily Activity     Outcome Measure   Help from another person eating meals?: None Help from another person taking care of personal grooming?: None Help from another person toileting, which includes using toliet, bedpan, or urinal?: A Little Help from another person bathing (including washing, rinsing, drying)?: A Little Help from another person to put on and taking off regular upper body clothing?: None Help from another person to put on and taking off regular lower body clothing?: A Little 6 Click Score: 21    End of Session Equipment Utilized During Treatment: Gait belt;Rolling walker;Other (comment)(LH reacher and sock aid)  OT Visit Diagnosis: Other abnormalities of gait and mobility (R26.89);Pain Pain - Right/Left: Left Pain - part of body: Knee;Leg   Activity Tolerance Patient tolerated treatment well   Patient Left in bed;with call bell/phone within reach;with bed alarm set;with SCD's reapplied;Other (comment)(With polar care in place and activated.)   Nurse Communication          Time: 1040-1107 OT Time Calculation (min): 27 min  Charges: OT General Charges $OT Visit: 1 Visit OT Treatments $Self Care/Home Management : 23-37 mins  Shara Blazing, M.S., OTR/L Ascom: [phone] 07/01/19, 11:45 AM

## 2019-07-01 NOTE — TOC Transition Note (Signed)
Transition of Care [facility]) - CM/SW Discharge Note   Patient Details  Name: Diane Dennis MRN: [id number] Date of Birth: 01-30-1946  Transition of Care [facility]) CM/SW Contact:  Zaccheus Edmister, Lenice Llamas Phone Number: [phone]  07/01/2019, 9:35 AM   Clinical Narrative: Clinical Social Worker (CSW) notified Helene Kelp [facility] home health representative that patient will D/C home today. [facility] [employer] D[employer] agency representative is aware that patient needs a rolling walker. Patient reported that she does not need a bedside commode. Patient is aware of her Lovenox price $29.46. Please reconsult if future social work needs arise. CSW signing off.     Final next level of care: [place] Barriers to Discharge: Barriers Resolved   Patient Goals and CMS Choice Patient states their goals for this hospitalization and ongoing recovery are:: To go home. CMS Medicare.gov Compare Post Acute Care list provided to:: Patient Choice offered to / list presented to : Patient  Discharge Placement                       Discharge Plan and Services In-house Referral: Clinical Social Work   Post Acute Care Choice: Home Health          DME Arranged: Gilford Rile rolling DME Agency: [employer] Date DME Agency Contacted: 06/30/19 Time DME Agency Contacted: [phone] Representative spoke with at DME Agency: [place]: PT [place]: [facility] (formerly [employer]) Date [place]: 07/01/19 Time [place]: [phone] Representative spoke with at [place]: [place] ([place]) Interventions     Readmission Risk Interventions No flowsheet data found.

## 2019-07-01 NOTE — Progress Notes (Signed)
Physical Therapy Treatment Patient Details Name: Diane Dennis MRN: [id number] DOB: 1945/10/14 Today's Date: 07/01/2019    History of Present Illness 73 y.o. who presents with degenerative arthrisis of the left knee. She is now s/p left total knee arthroplasty 06/29/19.    PT Comments    Pt agreeable to PT; reports 7/10 pain L knee post medication today. Focus on session was stair climbing. Limited ambulation for energy conservation for returning home today. Pt demonstrate supervision/mod I with bed mobility, transfers (only encouraged greater use of LLE) and in room ambulation. Min guard with stair training. Pt demonstrates good understanding of technique and ability to ascend/descend using RLE. Pt active range of motion L knee is 0-90 degrees. Pt educated on expected stretching protocols and activity level. Continue PT with HHPT to ensure safe home ambulation, progress strength and range and progress stair climbing.    Follow Up Recommendations  Home health PT     Equipment Recommendations  Rolling walker with 5" wheels    Recommendations for Other Services       Precautions / Restrictions Precautions Precautions: Fall;Knee Precaution Comments: High Fall Restrictions Weight Bearing Restrictions: Yes LLE Weight Bearing: Weight bearing as tolerated    Mobility  Bed Mobility Overal bed mobility: Modified Independent Bed Mobility: Supine to Sit;Sit to Supine     Supine to sit: Modified independent (Device/Increase time) Sit to supine: Modified independent (Device/Increase time)   General bed mobility comments: Mild increased time/effort; self assist LLE into bed  Transfers Overall transfer level: Modified independent Equipment used: Rolling walker (2 wheeled) Transfers: Sit to/from Stand Sit to Stand: Supervision;Modified independent (Device/Increase time)         General transfer comment: Performs well; only encouraged to use LLE more versus holding/kicking  out ( pt has 90 degrees of flexion)  Ambulation/Gait Ambulation/Gait assistance: Supervision Gait Distance (Feet): 20 Feet(remained in room to practice stair technique) Assistive device: Rolling walker (2 wheeled) Gait Pattern/deviations: Step-to pattern;Step-through pattern(partial step through)     General Gait Details: Pt able to ambulate further; limited to focus on stair climbing and preserve energy for returning home.   Stairs Stairs: Yes Stairs assistance: Min guard Stair Management: One rail Left;Forwards;Backwards Number of Stairs: 5 General stair comments: Performed with 1 rail L up forward down backward. Pt has counter surfaces in garage along steps on both sides that she will use for support.    Wheelchair Mobility    Modified Rankin (Stroke Patients Only)       Balance Overall balance assessment: Modified Independent Sitting-balance support: Feet supported Sitting balance-Leahy Scale: Normal Sitting balance - Comments: Steady static and dynamic sitting during functional tasks.   Standing balance support: Bilateral upper extremity supported Standing balance-Leahy Scale: Good Standing balance comment: Pt stands at sink to wash hands w/o use of UE support to maintain static standing balance.                            Cognition Arousal/Alertness: Awake/alert Behavior During Therapy: WFL for tasks assessed/performed Overall Cognitive Status: Within Functional Limits for tasks assessed                                        Exercises Total Joint Exercises Quad Sets: Strengthening;Both;20 reps Knee Flexion: AROM;Left;10 reps;Seated(3 positions each rep with hold) Goniometric ROM: 0-90 Other Exercises Other Exercises: Educated on  activity level expectations and positioning of knee Other Exercises: OT engages pt in variety of ADLs during session including functional mobility, grooming, toileting, and LB dressing this date. Pt return  demonstrates understanding of education provided on AE for LB dressing.    General Comments        Pertinent Vitals/Pain Pain Assessment: 0-10 Pain Score: 7  Pain Location: L knee Pain Descriptors / Indicators: Constant;Aching Pain Intervention(s): Monitored during session;Premedicated before session;Ice applied    Home Living                      Prior Function            PT Goals (current goals can now be found in the care plan section) Acute Rehab PT Goals Patient Stated Goal: get home to her dog Progress towards PT goals: Progressing toward goals(would benefit from HHPT to ensure safe ambulation in home)    Frequency    BID      PT Plan Current plan remains appropriate    Co-evaluation              AM-PAC PT "6 Clicks" Mobility   Outcome Measure  Help needed turning from your back to your side while in a flat bed without using bedrails?: None Help needed moving from lying on your back to sitting on the side of a flat bed without using bedrails?: None Help needed moving to and from a bed to a chair (including a wheelchair)?: None Help needed standing up from a chair using your arms (e.g., wheelchair or bedside chair)?: None Help needed to walk in hospital room?: None Help needed climbing 3-5 steps with a railing? : A Little 6 Click Score: 23    End of Session Equipment Utilized During Treatment: Gait belt Activity Tolerance: Patient tolerated treatment well Patient left: in bed;with call bell/phone within reach;with bed alarm set;Other (comment)(polar care)   PT Visit Diagnosis: Muscle weakness (generalized) (M62.81);Difficulty in walking, not elsewhere classified (R26.2);Pain Pain - Right/Left: Left Pain - part of body: Knee     Time: 1117-1140 PT Time Calculation (min) (ACUTE ONLY): 23 min  Charges:  $Gait Training: 8-22 mins $Therapeutic Exercise: 8-22 mins                      Diane Dennis, PTA 07/01/2019, 12:30 PM

## 2019-07-01 NOTE — Discharge Summary (Signed)
Physician Discharge Summary  Patient ID: Diane Dennis MRN: [id number] DOB/AGE: 1945-10-09 73 y.o.  Admit date: 06/29/2019 Discharge date: 07/01/2019  Admission Diagnoses:  PRIMARY OSTEOARTHRITIS OF LEFT KNEE.  Surgeries:  Left total knee arthroplasty using computer-assisted navigation  SURGEON:  Marciano Sequin. M.D.  ASSISTANT: Kinnie Feil, RN (present and scrubbed throughout the case, critical for assistance with exposure, retraction, instrumentation, and closure)  ANESTHESIA: spinal  ESTIMATED BLOOD LOSS: 50 mL  FLUIDS REPLACED: 1000 mL of crystalloid  TOURNIQUET TIME: 85 minutes  DRAINS: 2 medium Hemovac drains  SOFT TISSUE RELEASES: Anterior cruciate ligament, posterior cruciate ligament, deep medial collateral ligament, patellofemoral ligament  IMPLANTS UTILIZED: DePuy Attune size 6 posterior stabilized femoral component (cemented), size 6 rotating platform tibial component (cemented), 38 mm medialized dome patella (cemented), and an 8 mm stabilized rotating platform polyethylene insert.  Discharge Diagnoses: Patient Active Problem List   Diagnosis Date Noted  . Depression 06/29/2019  . GERD (gastroesophageal reflux disease) 06/29/2019  . Hyperlipidemia 06/29/2019  . Increased frequency of urination 06/29/2019  . Total knee replacement status 06/29/2019  . Primary osteoarthritis of left knee 05/06/2018  . Primary osteoarthritis of right knee 05/06/2018    Past Medical History:  Diagnosis Date  . Arthritis   . Cancer ([place])    melanoma  . Depression   . GERD (gastroesophageal reflux disease)      Transfusion: n/a   Consultants (if any):   Discharged Condition: Improved  Hospital Course: Diane Dennis is an 73 y.o. female who was admitted 06/29/2019 with a diagnosis of left knee osteoarthritis and went to the operating room on 06/29/2019 and underwent left total knee arthroplasty. The patient received perioperative  antibiotics for prophylaxis (see below). The patient tolerated the procedure well and was transported to PACU in stable condition. After meeting PACU criteria, the patient was subsequently transferred to the Orthopaedics/Rehabilitation unit.   The patient received DVT prophylaxis in the form of early mobilization, Lovenox, Foot Pumps and TED hose. A sacral pad had been placed and heels were elevated off of the bed with rolled towels in order to protect skin integrity. Foley catheter was discontinued on postoperative day #1. Wound drains were discontinued on postoperative day #2. The surgical incision was healing well without signs of infection.  Physical therapy was initiated postoperatively for transfers, gait training, and strengthening. Occupational therapy was initiated for activities of daily living and evaluation for assisted devices. Rehabilitation goals were reviewed in detail with the patient. The patient made steady progress with physical therapy and physical therapy recommended discharge to Home.   The patient achieved his preliminary goals of this hospitalization and was felt to be medically and orthopaedically appropriate for discharge.  She was given perioperative antibiotics:  Anti-infectives (From admission, onward)   Start     Dose/Rate Route Frequency Ordered Stop   06/29/19 1830  ceFAZolin (ANCEF) IVPB 2g/100 mL premix     2 g 200 mL/hr over 30 Minutes Intravenous Every 6 hours 06/29/19 1757 06/30/19 1829   06/29/19 0600  ceFAZolin (ANCEF) IVPB 2g/100 mL premix  Status:  Discontinued     2 g 200 mL/hr over 30 Minutes Intravenous On call to O.R. 06/28/19 2350 06/29/19 0954    .  Recent vital signs:  Vitals:   06/30/19 1500 06/30/19 2314  BP: 109/62 100/67  Pulse: 65 69  Resp:  16  Temp: 98.3 F (36.8 C) 98.1 F (36.7 C)  SpO2: 93% 90%    Recent laboratory  studies:  No results for input(s): WBC, HGB, HCT, PLT, K, CL, CO2, BUN, CREATININE, GLUCOSE, CALCIUM, LABPT, INR  in the last 72 hours.  Diagnostic Studies: Dg Knee Left Port  Result Date: 06/29/2019 CLINICAL DATA:  Postop left knee replacement EXAM: PORTABLE LEFT KNEE - 1-2 VIEW COMPARISON:  None. FINDINGS: There are expected postsurgical changes related to total knee arthroplasty. The hardware appears intact. A surgical drain is noted. There is overlying soft tissue edema and subcutaneous gas. Skin staples are noted. IMPRESSION: Satisfactory postoperative appearance status post total knee arthroplasty. No evidence of hardware complication. Electronically Signed   By: Constance Holster M.D.   On: 06/29/2019 16:00    Discharge Medications:   Allergies as of 07/01/2019   No Known Allergies     Medication List    TAKE these medications   celecoxib 200 MG capsule Commonly known as: CELEBREX Take 1 capsule (200 mg total) by mouth 2 (two) times daily.   ciprofloxacin 500 MG tablet Commonly known as: CIPRO Take 500 mg by mouth 2 (two) times daily.   citalopram 40 MG tablet Commonly known as: CELEXA Take 40 mg by mouth every morning.   enoxaparin 40 MG/0.4ML injection Commonly known as: LOVENOX Inject 0.4 mLs (40 mg total) into the skin daily for 14 days.   omeprazole 40 MG capsule Commonly known as: PRILOSEC Take 40 mg by mouth every morning.   oxybutynin 5 MG tablet Commonly known as: DITROPAN Take 5 mg by mouth every morning. And prn   oxyCODONE 5 MG immediate release tablet Commonly known as: Oxy IR/ROXICODONE Take 1 tablet (5 mg total) by mouth every 4 (four) hours as needed for moderate pain (pain score 4-6).   pravastatin 20 MG tablet Commonly known as: PRAVACHOL Take 20 mg by mouth at bedtime.            Durable Medical Equipment  (From admission, onward)         Start     Ordered   06/29/19 1758  DME Walker rolling  Once    Question:  Patient needs a walker to treat with the following condition  Answer:  Total knee replacement status   06/29/19 1757   06/29/19  1758  DME Bedside commode  Once    Question:  Patient needs a bedside commode to treat with the following condition  Answer:  Total knee replacement status   06/29/19 1757          Disposition: home w/ HH PT    Follow-up Information    Fausto Skillern, PA-C On 07/14/2019.   Specialty: Orthopedic Surgery Why: at 1:45pm Contact information: [place] [address] [phone]        Dereck Leep, MD On 08/11/2019.   Specialty: Orthopedic Surgery Why: at 1:45pm Contact information: [place] [address] [place], PA-C 07/01/2019, 7:19 AM

## 2019-07-01 NOTE — Progress Notes (Signed)
  Subjective: 2 Days Post-Op Procedure(s) (LRB): COMPUTER ASSISTED TOTAL KNEE ARTHROPLASTY (Left) Patient reports pain as  9 on 0-10 scale.   Patient is well, but has had some minor complaints of fatigue following PT. Plan is to go Home after hospital stay. Negative for chest pain and shortness of breath Fever: no Gastrointestinal: negative for nausea and vomiting.  Patient has not had a bowel movement.  Objective: Vital signs in last 24 hours: Temp:  [98.1 F (36.7 C)-98.5 F (36.9 C)] 98.1 F (36.7 C) (10/27 2314) Pulse Rate:  [63-69] 69 (10/27 2314) Resp:  [16-17] 16 (10/27 2314) BP: (93-124)/(48-72) 100/67 (10/27 2314) SpO2:  [90 %-96 %] 90 % (10/27 2314)  Intake/Output from previous day:  Intake/Output Summary (Last 24 hours) at 07/01/2019 0710 Last data filed at 07/01/2019 0542 Gross per 24 hour  Intake 951.07 ml  Output 1250 ml  Net -298.93 ml    Intake/Output this shift: No intake/output data recorded.  Labs: No results for input(s): HGB in the last 72 hours. No results for input(s): WBC, RBC, HCT, PLT in the last 72 hours. No results for input(s): NA, K, CL, CO2, BUN, CREATININE, GLUCOSE, CALCIUM in the last 72 hours. No results for input(s): LABPT, INR in the last 72 hours.   EXAM General - Patient is Alert, Appropriate and Oriented Extremity - Neurovascular intact Dorsiflexion/Plantar flexion intact Compartment soft Dressing/Incision -postoperative dressing remains in place.  Polar Care remains in place.  Hemovac in place.  Minimal sanguinous drainage. Motor Function - intact, moving foot and toes well on exam.  Cardiovascular- Regular rate and rhythm, no murmurs/rubs/gallops Respiratory- Lungs clear to auscultation bilaterally Gastrointestinal- active bowel sounds   Assessment/Plan: 2 Days Post-Op Procedure(s) (LRB): COMPUTER ASSISTED TOTAL KNEE ARTHROPLASTY (Left) Active Problems:   Total knee replacement status  Estimated body mass index is  27.67 kg/m as calculated from the following:   Height as of this encounter: 5\' 9"  (1.753 m).   Weight as of this encounter: 85 kg. Advance diet Up with therapy Discharge home with home health  Discharge patient pending bowel movement and completion of physical therapy requirements.  DVT Prophylaxis - Lovenox and foot pumps Weight-Bearing as tolerated to left leg. Patient receiving pain medication as encounter ended. Hemovac and postoperative dressing removed.  New honeycomb dressing applied.   Cassell Smiles, PA-C [facility] Orthopaedic Surgery 07/01/2019, 7:10 AM

## 2019-07-01 NOTE — Progress Notes (Signed)
Discharge summary reviewed with verbal understanding. Answered all questions. Thigh Ted placed per order and provided 2 dressings for home use. Lovenox kit given at discharge. Belongs packed and given, security items returned by security. Escorted to personal

## 2019-07-03 DIAGNOSIS — Z471 Aftercare following joint replacement surgery: Secondary | ICD-10-CM | POA: Diagnosis not present

## 2019-07-03 DIAGNOSIS — Z85820 Personal history of malignant melanoma of skin: Secondary | ICD-10-CM | POA: Diagnosis not present

## 2019-07-03 DIAGNOSIS — M1711 Unilateral primary osteoarthritis, right knee: Secondary | ICD-10-CM | POA: Diagnosis not present

## 2019-07-03 DIAGNOSIS — F329 Major depressive disorder, single episode, unspecified: Secondary | ICD-10-CM | POA: Diagnosis not present

## 2019-07-03 DIAGNOSIS — Z9181 History of falling: Secondary | ICD-10-CM | POA: Diagnosis not present

## 2019-07-03 DIAGNOSIS — K219 Gastro-esophageal reflux disease without esophagitis: Secondary | ICD-10-CM | POA: Diagnosis not present

## 2019-07-03 DIAGNOSIS — Z7901 Long term (current) use of anticoagulants: Secondary | ICD-10-CM | POA: Diagnosis not present

## 2019-07-03 DIAGNOSIS — Z96652 Presence of left artificial knee joint: Secondary | ICD-10-CM | POA: Diagnosis not present

## 2019-07-03 DIAGNOSIS — E785 Hyperlipidemia, unspecified: Secondary | ICD-10-CM | POA: Diagnosis not present

## 2019-07-06 DIAGNOSIS — Z85820 Personal history of malignant melanoma of skin: Secondary | ICD-10-CM | POA: Diagnosis not present

## 2019-07-06 DIAGNOSIS — Z96652 Presence of left artificial knee joint: Secondary | ICD-10-CM | POA: Diagnosis not present

## 2019-07-06 DIAGNOSIS — Z471 Aftercare following joint replacement surgery: Secondary | ICD-10-CM | POA: Diagnosis not present

## 2019-07-06 DIAGNOSIS — K219 Gastro-esophageal reflux disease without esophagitis: Secondary | ICD-10-CM | POA: Diagnosis not present

## 2019-07-06 DIAGNOSIS — F329 Major depressive disorder, single episode, unspecified: Secondary | ICD-10-CM | POA: Diagnosis not present

## 2019-07-06 DIAGNOSIS — E785 Hyperlipidemia, unspecified: Secondary | ICD-10-CM | POA: Diagnosis not present

## 2019-07-06 DIAGNOSIS — Z9181 History of falling: Secondary | ICD-10-CM | POA: Diagnosis not present

## 2019-07-06 DIAGNOSIS — M1711 Unilateral primary osteoarthritis, right knee: Secondary | ICD-10-CM | POA: Diagnosis not present

## 2019-07-06 DIAGNOSIS — Z7901 Long term (current) use of anticoagulants: Secondary | ICD-10-CM | POA: Diagnosis not present

## 2019-07-14 DIAGNOSIS — M25662 Stiffness of left knee, not elsewhere classified: Secondary | ICD-10-CM | POA: Diagnosis not present

## 2019-07-14 DIAGNOSIS — M25462 Effusion, left knee: Secondary | ICD-10-CM | POA: Diagnosis not present

## 2019-07-14 DIAGNOSIS — R29898 Other symptoms and signs involving the musculoskeletal system: Secondary | ICD-10-CM | POA: Diagnosis not present

## 2019-07-14 DIAGNOSIS — M25562 Pain in left knee: Secondary | ICD-10-CM | POA: Diagnosis not present

## 2019-07-17 DIAGNOSIS — M25562 Pain in left knee: Secondary | ICD-10-CM | POA: Diagnosis not present

## 2019-07-17 DIAGNOSIS — R29898 Other symptoms and signs involving the musculoskeletal system: Secondary | ICD-10-CM | POA: Diagnosis not present

## 2019-07-17 DIAGNOSIS — M25462 Effusion, left knee: Secondary | ICD-10-CM | POA: Diagnosis not present

## 2019-07-17 DIAGNOSIS — M25662 Stiffness of left knee, not elsewhere classified: Secondary | ICD-10-CM | POA: Diagnosis not present

## 2019-07-21 DIAGNOSIS — M25462 Effusion, left knee: Secondary | ICD-10-CM | POA: Diagnosis not present

## 2019-07-21 DIAGNOSIS — M25562 Pain in left knee: Secondary | ICD-10-CM | POA: Diagnosis not present

## 2019-07-24 DIAGNOSIS — M25462 Effusion, left knee: Secondary | ICD-10-CM | POA: Diagnosis not present

## 2019-07-24 DIAGNOSIS — M25562 Pain in left knee: Secondary | ICD-10-CM | POA: Diagnosis not present

## 2019-07-27 DIAGNOSIS — M25462 Effusion, left knee: Secondary | ICD-10-CM | POA: Diagnosis not present

## 2019-07-27 DIAGNOSIS — M25562 Pain in left knee: Secondary | ICD-10-CM | POA: Diagnosis not present

## 2019-08-11 DIAGNOSIS — M1712 Unilateral primary osteoarthritis, left knee: Secondary | ICD-10-CM | POA: Diagnosis not present

## 2019-08-16 DIAGNOSIS — Z471 Aftercare following joint replacement surgery: Secondary | ICD-10-CM | POA: Diagnosis not present

## 2019-08-25 DIAGNOSIS — Z79899 Other long term (current) drug therapy: Secondary | ICD-10-CM | POA: Diagnosis not present

## 2019-08-25 DIAGNOSIS — R350 Frequency of micturition: Secondary | ICD-10-CM | POA: Diagnosis not present

## 2019-08-25 DIAGNOSIS — M1712 Unilateral primary osteoarthritis, left knee: Secondary | ICD-10-CM | POA: Diagnosis not present

## 2019-08-25 DIAGNOSIS — K219 Gastro-esophageal reflux disease without esophagitis: Secondary | ICD-10-CM | POA: Diagnosis not present

## 2019-08-25 DIAGNOSIS — E782 Mixed hyperlipidemia: Secondary | ICD-10-CM | POA: Diagnosis not present

## 2019-08-25 DIAGNOSIS — F3289 Other specified depressive episodes: Secondary | ICD-10-CM | POA: Diagnosis not present

## 2020-02-11 DIAGNOSIS — Z20828 Contact with and (suspected) exposure to other viral communicable diseases: Secondary | ICD-10-CM | POA: Diagnosis not present

## 2020-02-11 DIAGNOSIS — U071 COVID-19: Secondary | ICD-10-CM | POA: Diagnosis not present

## 2020-08-03 ENCOUNTER — Other Ambulatory Visit: Payer: Self-pay | Admitting: Nurse Practitioner

## 2020-08-03 DIAGNOSIS — Z1231 Encounter for screening mammogram for malignant neoplasm of breast: Secondary | ICD-10-CM

## 2020-08-03 DIAGNOSIS — Z8616 Personal history of COVID-19: Secondary | ICD-10-CM | POA: Diagnosis not present

## 2020-08-03 DIAGNOSIS — Z23 Encounter for immunization: Secondary | ICD-10-CM | POA: Diagnosis not present

## 2020-08-03 DIAGNOSIS — E782 Mixed hyperlipidemia: Secondary | ICD-10-CM | POA: Diagnosis not present

## 2020-08-03 DIAGNOSIS — Z79899 Other long term (current) drug therapy: Secondary | ICD-10-CM | POA: Diagnosis not present

## 2020-08-03 DIAGNOSIS — Z0000 Encounter for general adult medical examination without abnormal findings: Secondary | ICD-10-CM | POA: Diagnosis not present

## 2020-08-03 DIAGNOSIS — K219 Gastro-esophageal reflux disease without esophagitis: Secondary | ICD-10-CM | POA: Diagnosis not present

## 2020-08-03 DIAGNOSIS — R0602 Shortness of breath: Secondary | ICD-10-CM | POA: Diagnosis not present

## 2020-08-03 DIAGNOSIS — R350 Frequency of micturition: Secondary | ICD-10-CM | POA: Diagnosis not present

## 2020-08-03 DIAGNOSIS — R0600 Dyspnea, unspecified: Secondary | ICD-10-CM | POA: Diagnosis not present

## 2020-08-03 DIAGNOSIS — F3289 Other specified depressive episodes: Secondary | ICD-10-CM | POA: Diagnosis not present

## 2020-08-04 ENCOUNTER — Other Ambulatory Visit: Payer: Self-pay | Admitting: Nurse Practitioner

## 2020-08-04 DIAGNOSIS — Z1231 Encounter for screening mammogram for malignant neoplasm of breast: Secondary | ICD-10-CM

## 2020-08-08 DIAGNOSIS — L570 Actinic keratosis: Secondary | ICD-10-CM | POA: Diagnosis not present

## 2020-08-08 DIAGNOSIS — L298 Other pruritus: Secondary | ICD-10-CM | POA: Diagnosis not present

## 2020-08-08 DIAGNOSIS — L578 Other skin changes due to chronic exposure to nonionizing radiation: Secondary | ICD-10-CM | POA: Diagnosis not present

## 2020-08-08 DIAGNOSIS — L821 Other seborrheic keratosis: Secondary | ICD-10-CM | POA: Diagnosis not present

## 2020-08-08 DIAGNOSIS — Z85828 Personal history of other malignant neoplasm of skin: Secondary | ICD-10-CM | POA: Diagnosis not present

## 2020-08-08 DIAGNOSIS — L290 Pruritus ani: Secondary | ICD-10-CM | POA: Diagnosis not present

## 2020-08-08 DIAGNOSIS — Z872 Personal history of diseases of the skin and subcutaneous tissue: Secondary | ICD-10-CM | POA: Diagnosis not present

## 2021-01-18 ENCOUNTER — Other Ambulatory Visit
Admission: RE | Admit: 2021-01-18 | Discharge: 2021-01-18 | Disposition: A | Discharge status: Home / Self Care | Payer: PPO | Source: Ambulatory Visit | Attending: Pulmonary Disease | Admitting: Pulmonary Disease

## 2021-01-18 DIAGNOSIS — R0602 Shortness of breath: Secondary | ICD-10-CM | POA: Insufficient documentation

## 2021-01-18 DIAGNOSIS — R0609 Other forms of dyspnea: Secondary | ICD-10-CM | POA: Diagnosis not present

## 2021-01-18 DIAGNOSIS — U099 Post covid-19 condition, unspecified: Secondary | ICD-10-CM | POA: Diagnosis not present

## 2021-01-18 LAB — D-DIMER, QUANTITATIVE: D-Dimer, Quant: 0.41 ug/mL-FEU (ref 0.00–0.50)

## 2021-01-31 ENCOUNTER — Ambulatory Visit
Admission: RE | Admit: 2021-01-31 | Discharge: 2021-01-31 | Disposition: A | Discharge status: Home / Self Care | Payer: PPO | Source: Ambulatory Visit | Attending: Nurse Practitioner | Admitting: Nurse Practitioner

## 2021-01-31 ENCOUNTER — Other Ambulatory Visit: Payer: Self-pay

## 2021-01-31 DIAGNOSIS — Z1231 Encounter for screening mammogram for malignant neoplasm of breast: Secondary | ICD-10-CM | POA: Insufficient documentation

## 2021-02-03 DIAGNOSIS — Z79899 Other long term (current) drug therapy: Secondary | ICD-10-CM | POA: Diagnosis not present

## 2021-02-03 DIAGNOSIS — G6289 Other specified polyneuropathies: Secondary | ICD-10-CM | POA: Diagnosis not present

## 2021-02-03 DIAGNOSIS — F3289 Other specified depressive episodes: Secondary | ICD-10-CM | POA: Diagnosis not present

## 2021-02-03 DIAGNOSIS — K219 Gastro-esophageal reflux disease without esophagitis: Secondary | ICD-10-CM | POA: Diagnosis not present

## 2021-02-03 DIAGNOSIS — E782 Mixed hyperlipidemia: Secondary | ICD-10-CM | POA: Diagnosis not present

## 2021-02-03 DIAGNOSIS — R350 Frequency of micturition: Secondary | ICD-10-CM | POA: Diagnosis not present

## 2021-02-03 DIAGNOSIS — J449 Chronic obstructive pulmonary disease, unspecified: Secondary | ICD-10-CM | POA: Diagnosis not present

## 2021-02-08 DIAGNOSIS — L290 Pruritus ani: Secondary | ICD-10-CM | POA: Diagnosis not present

## 2021-02-28 DIAGNOSIS — J449 Chronic obstructive pulmonary disease, unspecified: Secondary | ICD-10-CM | POA: Diagnosis not present

## 2021-08-24 DIAGNOSIS — Z85828 Personal history of other malignant neoplasm of skin: Secondary | ICD-10-CM | POA: Diagnosis not present

## 2021-08-24 DIAGNOSIS — L218 Other seborrheic dermatitis: Secondary | ICD-10-CM | POA: Diagnosis not present

## 2021-08-24 DIAGNOSIS — Z859 Personal history of malignant neoplasm, unspecified: Secondary | ICD-10-CM | POA: Diagnosis not present

## 2021-08-24 DIAGNOSIS — Z872 Personal history of diseases of the skin and subcutaneous tissue: Secondary | ICD-10-CM | POA: Diagnosis not present

## 2021-08-24 DIAGNOSIS — D2330 Other benign neoplasm of skin of unspecified part of face: Secondary | ICD-10-CM | POA: Diagnosis not present

## 2021-08-24 DIAGNOSIS — L578 Other skin changes due to chronic exposure to nonionizing radiation: Secondary | ICD-10-CM | POA: Diagnosis not present

## 2021-08-24 DIAGNOSIS — L570 Actinic keratosis: Secondary | ICD-10-CM | POA: Diagnosis not present

## 2021-08-29 DIAGNOSIS — J449 Chronic obstructive pulmonary disease, unspecified: Secondary | ICD-10-CM | POA: Diagnosis not present

## 2021-08-29 DIAGNOSIS — I10 Essential (primary) hypertension: Secondary | ICD-10-CM | POA: Diagnosis not present

## 2022-03-22 DIAGNOSIS — R0600 Dyspnea, unspecified: Secondary | ICD-10-CM | POA: Diagnosis not present

## 2022-08-20 DIAGNOSIS — R053 Chronic cough: Secondary | ICD-10-CM | POA: Diagnosis not present

## 2022-08-22 DIAGNOSIS — R200 Anesthesia of skin: Secondary | ICD-10-CM | POA: Diagnosis not present

## 2022-08-22 DIAGNOSIS — E782 Mixed hyperlipidemia: Secondary | ICD-10-CM | POA: Diagnosis not present

## 2022-08-22 DIAGNOSIS — Z79899 Other long term (current) drug therapy: Secondary | ICD-10-CM | POA: Diagnosis not present

## 2022-08-22 DIAGNOSIS — J449 Chronic obstructive pulmonary disease, unspecified: Secondary | ICD-10-CM | POA: Diagnosis not present

## 2022-08-22 DIAGNOSIS — F3289 Other specified depressive episodes: Secondary | ICD-10-CM | POA: Diagnosis not present

## 2022-08-22 DIAGNOSIS — Z0000 Encounter for general adult medical examination without abnormal findings: Secondary | ICD-10-CM | POA: Diagnosis not present

## 2022-08-22 DIAGNOSIS — R202 Paresthesia of skin: Secondary | ICD-10-CM | POA: Diagnosis not present

## 2022-08-22 DIAGNOSIS — R7309 Other abnormal glucose: Secondary | ICD-10-CM | POA: Diagnosis not present

## 2022-08-22 DIAGNOSIS — K219 Gastro-esophageal reflux disease without esophagitis: Secondary | ICD-10-CM | POA: Diagnosis not present

## 2022-08-22 DIAGNOSIS — R399 Unspecified symptoms and signs involving the genitourinary system: Secondary | ICD-10-CM | POA: Diagnosis not present

## 2022-12-21 IMAGING — MG MM DIGITAL SCREENING BILAT W/ TOMO AND CAD
8 series · 8 of 24 positions shown · non-contrast
Comparison: Previous exam(s).

CLINICAL DATA: Screening.

EXAM:
DIGITAL SCREENING BILATERAL MAMMOGRAM WITH TOMOSYNTHESIS AND CAD
TECHNIQUE: Bilateral screening digital craniocaudal and mediolateral oblique
mammograms were obtained. Bilateral screening digital breast
tomosynthesis was performed. The images were evaluated with
computer-aided detection.

[L CC synth-2D]
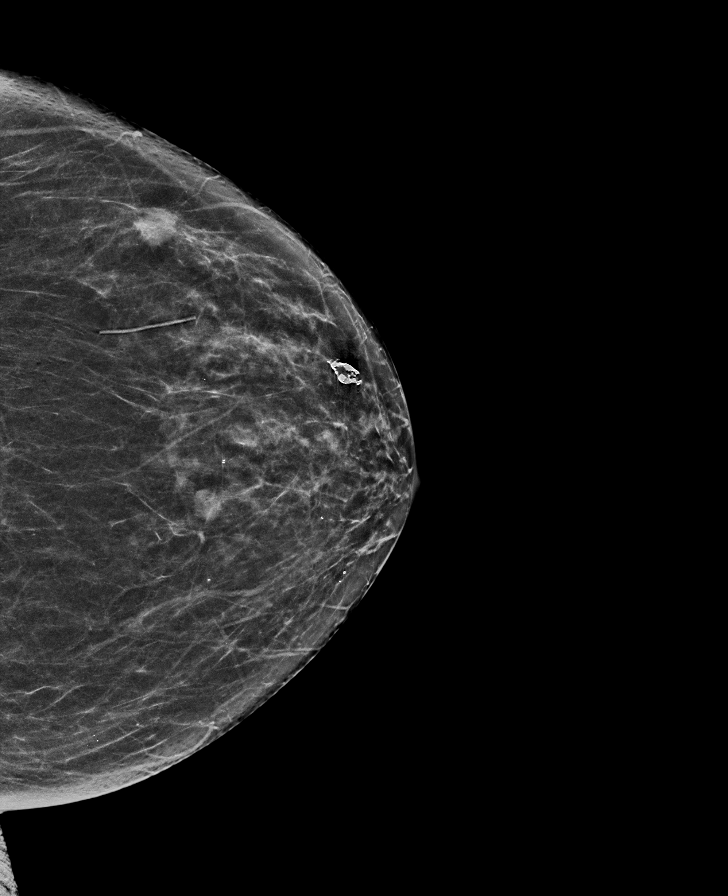

[R MLO synth-2D]
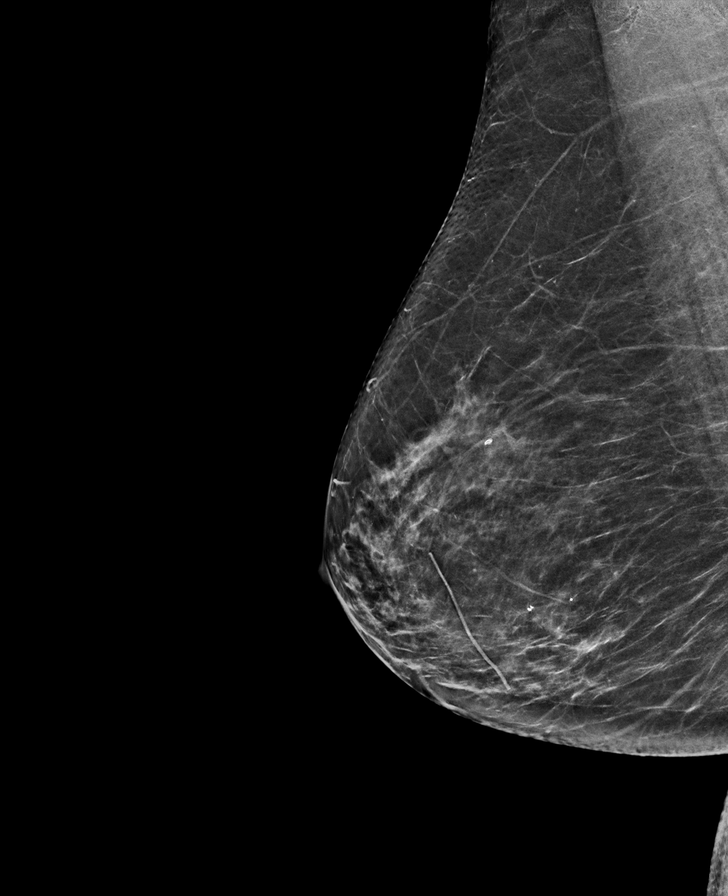

[L MLO synth-2D]
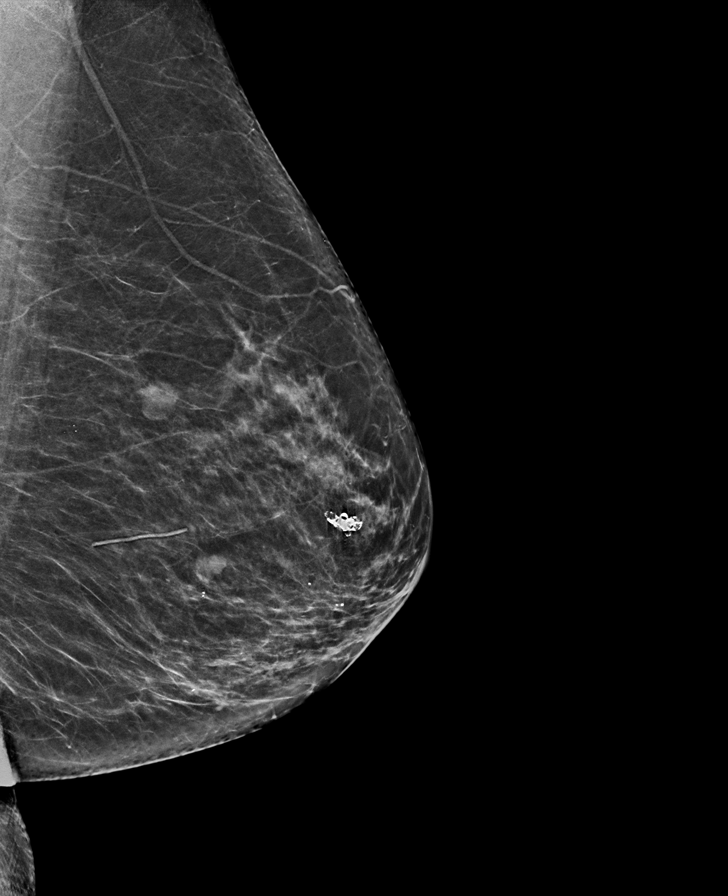

[R CC synth-2D]
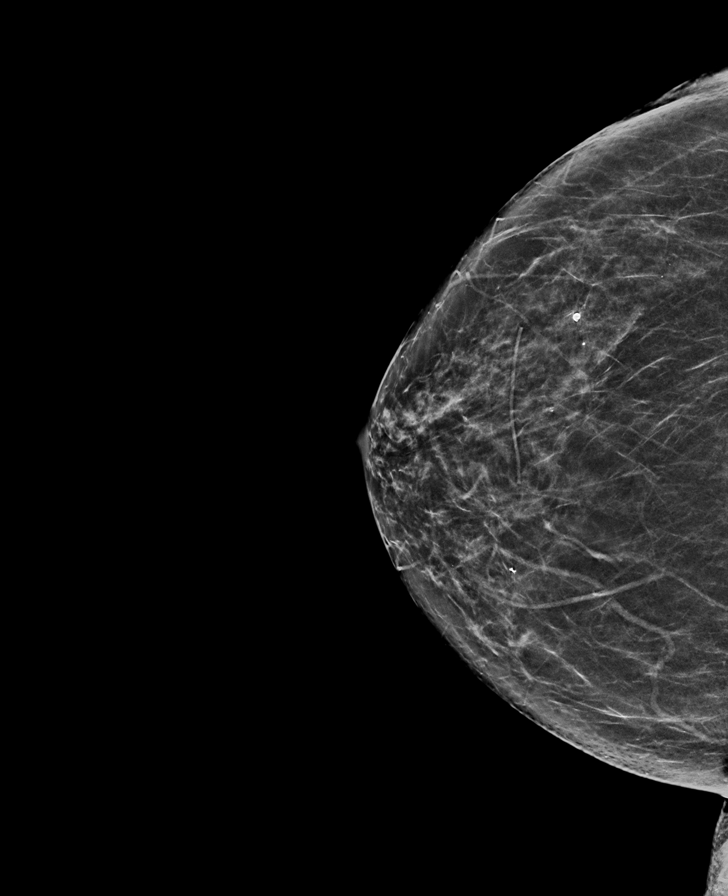

[L MLO tomo · tomo slice 34/67.0]
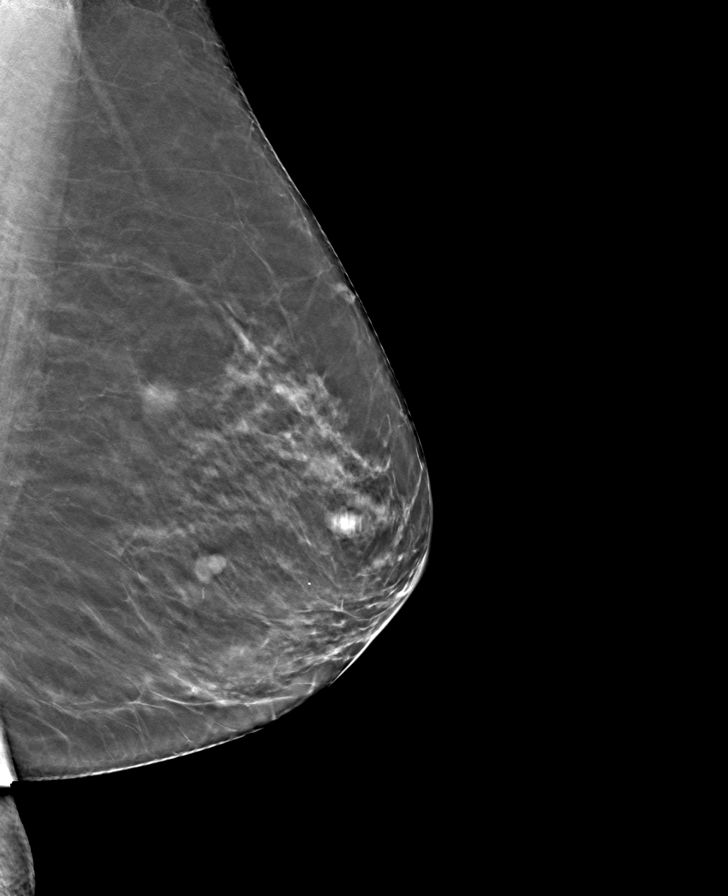

[L CC tomo · tomo slice 33/66.0]
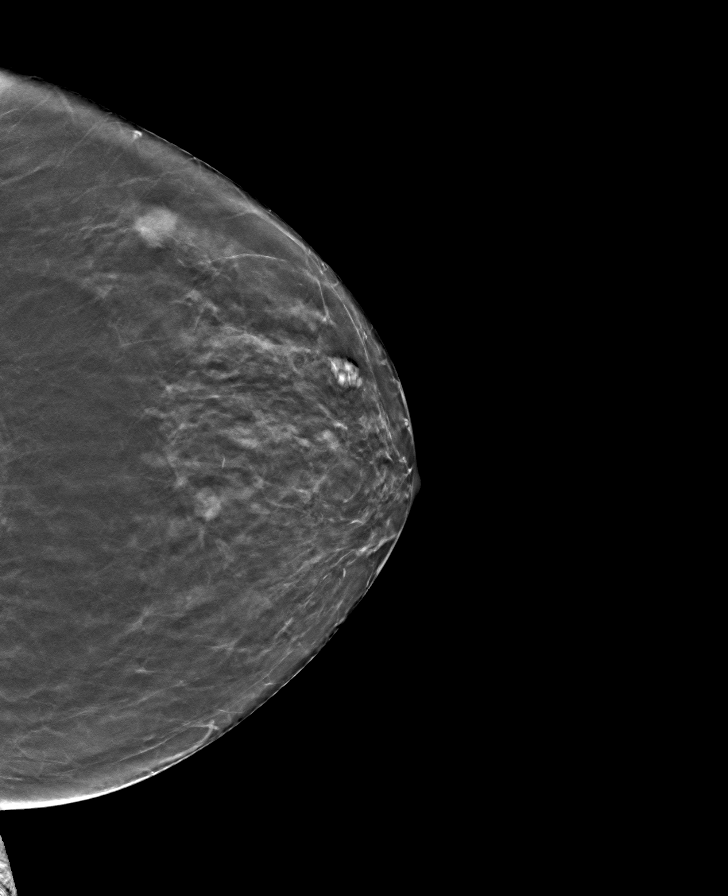

[R MLO tomo · tomo slice 35/68.0]
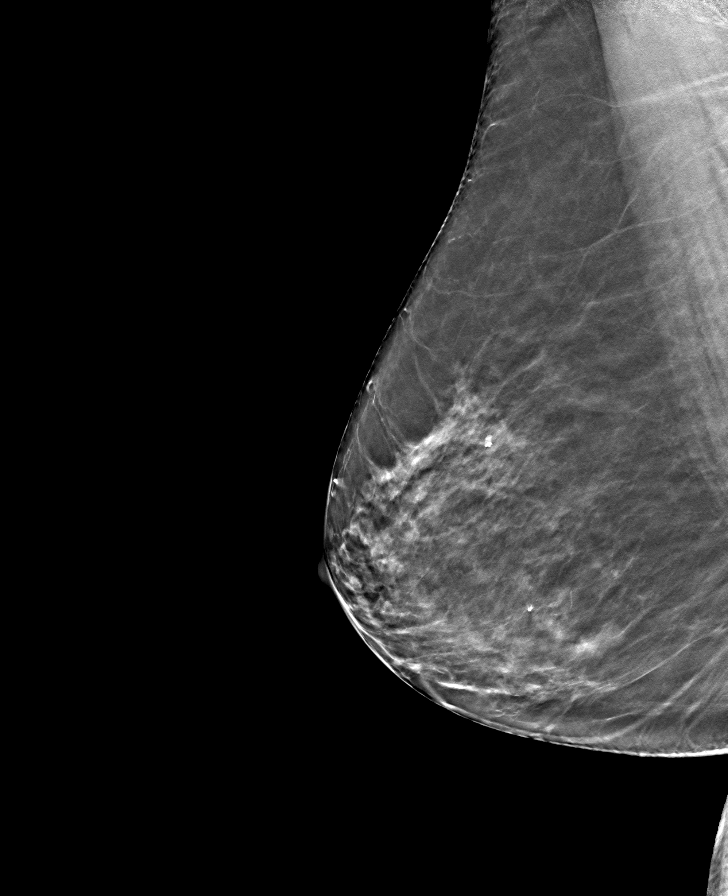

[R CC tomo · tomo slice 33/64.0]
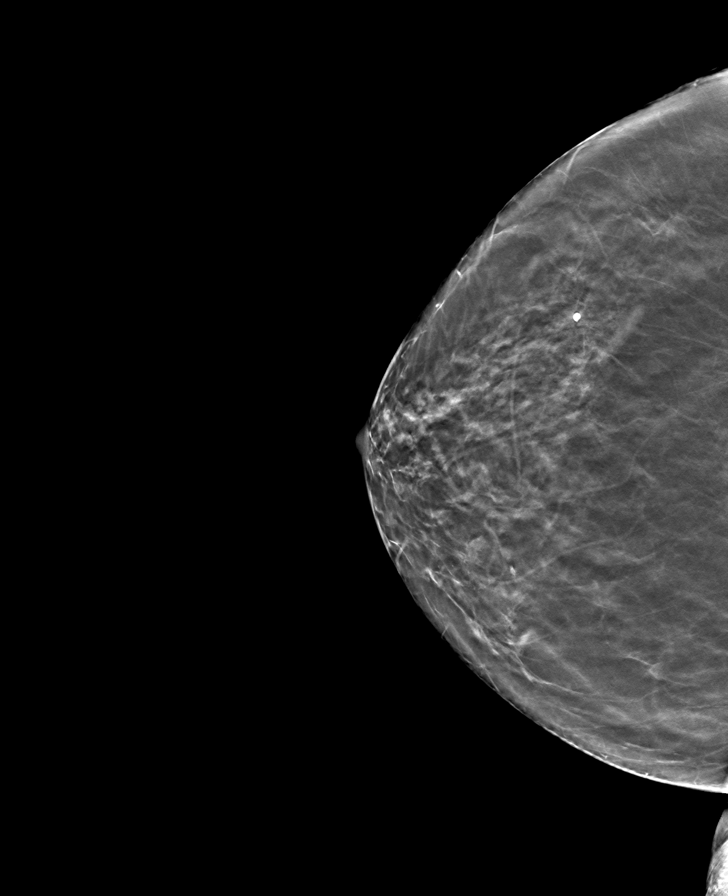

[8 of 24 positions shown; findings below may reference images not displayed]

ACR Breast Density Category b: There are scattered areas of
fibroglandular density.
FINDINGS: There are no findings suspicious for malignancy. The images were
evaluated with computer-aided detection.
IMPRESSION: No mammographic evidence of malignancy. A result letter of this
screening mammogram will be mailed directly to the patient.

RECOMMENDATION:
Screening mammogram in one year. (Code:WJ-I-BG6)

BI-RADS CATEGORY  1: Negative.
